# Patient Record
Sex: Female | Born: 1960 | Race: White | Hispanic: No | Smoking: Never smoker
Health system: Southern US, Community
[De-identification: ages and names within clinical notes are randomized; demographics above are authoritative.]

---

## 1997-07-17 ENCOUNTER — Other Ambulatory Visit: Admission: RE | Admit: 1997-07-17 | Discharge: 1997-07-17 | Payer: Self-pay | Admitting: *Deleted

## 1998-07-19 ENCOUNTER — Other Ambulatory Visit: Admission: RE | Admit: 1998-07-19 | Discharge: 1998-07-19 | Payer: Self-pay | Admitting: Obstetrics and Gynecology

## 1999-08-06 ENCOUNTER — Other Ambulatory Visit: Admission: RE | Admit: 1999-08-06 | Discharge: 1999-08-06 | Payer: Self-pay | Admitting: Obstetrics and Gynecology

## 1999-10-06 ENCOUNTER — Other Ambulatory Visit: Admission: RE | Admit: 1999-10-06 | Discharge: 1999-10-06 | Payer: Self-pay | Admitting: Obstetrics and Gynecology

## 1999-10-20 ENCOUNTER — Other Ambulatory Visit: Admission: RE | Admit: 1999-10-20 | Discharge: 1999-10-20 | Payer: Self-pay | Admitting: Obstetrics and Gynecology

## 1999-10-20 ENCOUNTER — Encounter (INDEPENDENT_AMBULATORY_CARE_PROVIDER_SITE_OTHER): Payer: Self-pay | Admitting: Specialist

## 2000-05-14 ENCOUNTER — Other Ambulatory Visit: Admission: RE | Admit: 2000-05-14 | Discharge: 2000-05-14 | Payer: Self-pay | Admitting: Obstetrics and Gynecology

## 2000-12-07 ENCOUNTER — Other Ambulatory Visit: Admission: RE | Admit: 2000-12-07 | Discharge: 2000-12-07 | Payer: Self-pay | Admitting: Obstetrics and Gynecology

## 2001-05-09 ENCOUNTER — Other Ambulatory Visit: Admission: RE | Admit: 2001-05-09 | Discharge: 2001-05-09 | Payer: Self-pay | Admitting: Obstetrics and Gynecology

## 2001-12-09 ENCOUNTER — Other Ambulatory Visit: Admission: RE | Admit: 2001-12-09 | Discharge: 2001-12-09 | Payer: Self-pay | Admitting: Obstetrics and Gynecology

## 2003-01-02 ENCOUNTER — Other Ambulatory Visit: Admission: RE | Admit: 2003-01-02 | Discharge: 2003-01-02 | Payer: Self-pay | Admitting: Obstetrics and Gynecology

## 2004-01-22 ENCOUNTER — Other Ambulatory Visit: Admission: RE | Admit: 2004-01-22 | Discharge: 2004-01-22 | Payer: Self-pay | Admitting: Obstetrics and Gynecology

## 2005-02-11 ENCOUNTER — Other Ambulatory Visit: Admission: RE | Admit: 2005-02-11 | Discharge: 2005-02-11 | Payer: Self-pay | Admitting: Obstetrics and Gynecology

## 2006-08-11 ENCOUNTER — Emergency Department (HOSPITAL_COMMUNITY): Admission: EM | Admit: 2006-08-11 | Discharge: 2006-08-11 | Payer: Self-pay | Admitting: Emergency Medicine

## 2010-03-28 ENCOUNTER — Emergency Department (HOSPITAL_COMMUNITY)
Admission: EM | Admit: 2010-03-28 | Discharge: 2010-03-28 | Payer: Self-pay | Source: Home / Self Care | Admitting: Family Medicine

## 2015-03-15 MED FILL — clonazePAM 0.5 MG TABS: 0.5 | 30 days supply | Qty: 30 | Fill #0

## 2015-03-18 MED FILL — SYNTHROID 75 MCG TABLET: 75 | 90 days supply | Qty: 90 | Fill #0

## 2015-04-16 MED FILL — clonazePAM 0.5 MG TABS: 0.5 | 30 days supply | Qty: 30 | Fill #0

## 2015-05-01 MED FILL — PROGESTERONE 100 MG CAPSULE: 100 | 90 days supply | Qty: 90 | Fill #2

## 2015-05-06 DIAGNOSIS — H524 Presbyopia: Secondary | ICD-10-CM | POA: Diagnosis not present

## 2015-05-06 DIAGNOSIS — H5213 Myopia, bilateral: Secondary | ICD-10-CM | POA: Diagnosis not present

## 2015-05-14 MED FILL — clonazePAM 0.5 MG TABS: 0.5 | 30 days supply | Qty: 30 | Fill #0

## 2015-05-20 DIAGNOSIS — L0292 Furuncle, unspecified: Secondary | ICD-10-CM | POA: Diagnosis not present

## 2015-05-20 DIAGNOSIS — N9089 Other specified noninflammatory disorders of vulva and perineum: Secondary | ICD-10-CM | POA: Diagnosis not present

## 2015-06-13 MED FILL — clonazePAM 0.5 MG TABS: 0.5 | 30 days supply | Qty: 30 | Fill #0

## 2015-06-17 MED FILL — SYNTHROID 75 MCG TABLET: 75 | 90 days supply | Qty: 90 | Fill #1

## 2015-07-11 MED FILL — clonazePAM 0.5 MG TABS: 0.5 | 30 days supply | Qty: 30 | Fill #0

## 2015-07-31 MED FILL — PROGESTERONE 100 MG CAPSULE: 100 | 90 days supply | Qty: 90 | Fill #3

## 2015-09-04 MED FILL — clonazePAM 0.5 MG TABS: 0.5 | 30 days supply | Qty: 30 | Fill #0

## 2015-09-23 MED FILL — SYNTHROID 75 MCG TABLET: 75 | 90 days supply | Qty: 90 | Fill #2

## 2015-09-24 MED FILL — ZOLPIDEM TART ER 12.5 MG TA: 12.5 | 20 days supply | Qty: 20 | Fill #0

## 2015-10-18 MED FILL — ZOLPIDEM TART ER 12.5 MG TA: 12.5 | 20 days supply | Qty: 20 | Fill #0

## 2015-10-29 MED FILL — PROGESTERONE 100 MG CAPSULE: 100 | 90 days supply | Qty: 90 | Fill #0

## 2015-11-05 DIAGNOSIS — E039 Hypothyroidism, unspecified: Secondary | ICD-10-CM | POA: Diagnosis not present

## 2015-11-05 DIAGNOSIS — Z0000 Encounter for general adult medical examination without abnormal findings: Secondary | ICD-10-CM | POA: Diagnosis not present

## 2015-11-08 MED FILL — ZOLPIDEM TART ER 12.5 MG TA: 12.5 | 20 days supply | Qty: 20 | Fill #1

## 2015-11-11 DIAGNOSIS — Z8781 Personal history of (healed) traumatic fracture: Secondary | ICD-10-CM | POA: Diagnosis not present

## 2015-11-11 DIAGNOSIS — Z0000 Encounter for general adult medical examination without abnormal findings: Secondary | ICD-10-CM | POA: Diagnosis not present

## 2015-11-11 DIAGNOSIS — F5104 Psychophysiologic insomnia: Secondary | ICD-10-CM | POA: Diagnosis not present

## 2015-11-11 DIAGNOSIS — L719 Rosacea, unspecified: Secondary | ICD-10-CM | POA: Diagnosis not present

## 2015-11-11 MED FILL — SERTRALINE HCL 50 MG TABLET: 50 | 90 days supply | Qty: 90 | Fill #0

## 2015-11-25 DIAGNOSIS — Z1231 Encounter for screening mammogram for malignant neoplasm of breast: Secondary | ICD-10-CM | POA: Diagnosis not present

## 2015-11-25 DIAGNOSIS — Z01419 Encounter for gynecological examination (general) (routine) without abnormal findings: Secondary | ICD-10-CM | POA: Diagnosis not present

## 2015-11-25 DIAGNOSIS — Z6822 Body mass index (BMI) 22.0-22.9, adult: Secondary | ICD-10-CM | POA: Diagnosis not present

## 2015-11-28 MED FILL — ZOLPIDEM TART ER 12.5 MG TA: 12.5 | 20 days supply | Qty: 20 | Fill #2

## 2015-12-18 MED FILL — ZOLPIDEM TART ER 12.5 MG TA: 12.5 | 30 days supply | Qty: 30 | Fill #0

## 2015-12-23 MED FILL — SYNTHROID 75 MCG TABLET: 75 | 90 days supply | Qty: 90 | Fill #3

## 2015-12-30 DIAGNOSIS — F5104 Psychophysiologic insomnia: Secondary | ICD-10-CM | POA: Diagnosis not present

## 2015-12-30 DIAGNOSIS — F419 Anxiety disorder, unspecified: Secondary | ICD-10-CM | POA: Diagnosis not present

## 2015-12-31 MED FILL — ESZOPICLONE 3 MG TABLET: 3 | 30 days supply | Qty: 30 | Fill #0

## 2016-01-29 MED FILL — PROGESTERONE 100 MG CAPSULE: 100 | 90 days supply | Qty: 90 | Fill #0

## 2016-01-29 MED FILL — ESZOPICLONE 3 MG TABLET: 3 | 30 days supply | Qty: 30 | Fill #1

## 2016-02-21 MED FILL — SERTRALINE HCL 50 MG TABLET: 50 | 90 days supply | Qty: 90 | Fill #1

## 2016-02-21 MED FILL — MESALAMINE DR 1.2 GM TABLET: 1.2 | 30 days supply | Qty: 120 | Fill #0

## 2016-02-28 MED FILL — ESZOPICLONE 3 MG TABLET: 3 | 30 days supply | Qty: 30 | Fill #0

## 2016-03-06 MED FILL — OSELTAMIVIR PHOS 75 MG CAP: 75 | 5 days supply | Qty: 10 | Fill #0

## 2016-03-09 DIAGNOSIS — K5289 Other specified noninfective gastroenteritis and colitis: Secondary | ICD-10-CM | POA: Diagnosis not present

## 2016-03-16 DIAGNOSIS — R197 Diarrhea, unspecified: Secondary | ICD-10-CM | POA: Diagnosis not present

## 2016-03-20 MED FILL — SYNTHROID 75 MCG TABLET: 75 | 90 days supply | Qty: 90 | Fill #0

## 2016-03-20 MED FILL — LIALDA 1.2 GM TABLET SA: 1.2 | 90 days supply | Qty: 360 | Fill #1

## 2016-03-25 DIAGNOSIS — R197 Diarrhea, unspecified: Secondary | ICD-10-CM | POA: Diagnosis not present

## 2016-03-30 MED FILL — ESZOPICLONE 3 MG TABLET: 3 | 30 days supply | Qty: 30 | Fill #1

## 2016-04-27 MED FILL — PROGESTERONE 100 MG CAPSULE: 100 | 90 days supply | Qty: 90 | Fill #1

## 2016-04-27 MED FILL — ESZOPICLONE 3 MG TABLET: 3 | 30 days supply | Qty: 30 | Fill #2

## 2016-04-30 DIAGNOSIS — F419 Anxiety disorder, unspecified: Secondary | ICD-10-CM | POA: Diagnosis not present

## 2016-04-30 DIAGNOSIS — K5289 Other specified noninfective gastroenteritis and colitis: Secondary | ICD-10-CM | POA: Diagnosis not present

## 2016-05-06 DIAGNOSIS — H5213 Myopia, bilateral: Secondary | ICD-10-CM | POA: Diagnosis not present

## 2016-05-06 DIAGNOSIS — H524 Presbyopia: Secondary | ICD-10-CM | POA: Diagnosis not present

## 2016-05-25 MED FILL — SERTRALINE HCL 50 MG TABLET: 50 | 90 days supply | Qty: 90 | Fill #2

## 2016-05-25 MED FILL — ESZOPICLONE 3 MG TABLET: 3 | 30 days supply | Qty: 30 | Fill #0

## 2016-06-15 MED FILL — SYNTHROID 75 MCG TABLET: 75 | 90 days supply | Qty: 90 | Fill #1

## 2016-06-17 MED FILL — LIALDA 1.2 GM TABLET SA: 1.2 | 90 days supply | Qty: 360 | Fill #2

## 2016-06-25 MED FILL — ESZOPICLONE 3 MG TABLET: 3 | 30 days supply | Qty: 30 | Fill #1

## 2016-07-23 MED FILL — ESZOPICLONE 3 MG TABLET: 3 | 30 days supply | Qty: 30 | Fill #2

## 2016-07-23 MED FILL — PROGESTERONE 100 MG CAPSULE: 100 | 90 days supply | Qty: 90 | Fill #2

## 2016-08-26 MED FILL — SERTRALINE HCL 50 MG TABLET: 50 | 90 days supply | Qty: 90 | Fill #3

## 2016-08-27 MED FILL — ESZOPICLONE 3 MG TABLET: 3 | 30 days supply | Qty: 30 | Fill #0

## 2016-09-16 MED FILL — SYNTHROID 75 MCG TABLET: 75 | 30 days supply | Qty: 30 | Fill #2

## 2016-09-16 MED FILL — LIALDA 1.2 GM TABLET SA: 1.2 | 90 days supply | Qty: 360 | Fill #3

## 2016-09-28 MED FILL — ESZOPICLONE 3 MG TABLET: 3 | 30 days supply | Qty: 30 | Fill #1

## 2016-10-20 MED FILL — PROGESTERONE 100 MG CAPSULE: 100 | 90 days supply | Qty: 90 | Fill #3

## 2016-10-20 MED FILL — SYNTHROID 75 MCG TABLET: 75 | 30 days supply | Qty: 30 | Fill #3

## 2016-10-29 MED FILL — ESZOPICLONE 3 MG TABLET: 3 | 30 days supply | Qty: 30 | Fill #2

## 2016-11-04 DIAGNOSIS — E039 Hypothyroidism, unspecified: Secondary | ICD-10-CM | POA: Diagnosis not present

## 2016-11-04 DIAGNOSIS — Z0000 Encounter for general adult medical examination without abnormal findings: Secondary | ICD-10-CM | POA: Diagnosis not present

## 2016-11-12 DIAGNOSIS — E875 Hyperkalemia: Secondary | ICD-10-CM | POA: Diagnosis not present

## 2016-11-12 MED FILL — CYCLOBENZAPRINE 5 MG TABLET: 5 | 15 days supply | Qty: 45 | Fill #0

## 2016-11-12 MED FILL — SERTRALINE HCL 50 MG TABLET: 50 | 90 days supply | Qty: 90 | Fill #0

## 2016-11-13 MED FILL — SYNTHROID 75 MCG TABLET: 75 | 90 days supply | Qty: 90 | Fill #0

## 2016-11-26 DIAGNOSIS — Z01419 Encounter for gynecological examination (general) (routine) without abnormal findings: Secondary | ICD-10-CM | POA: Diagnosis not present

## 2016-11-26 DIAGNOSIS — Z1231 Encounter for screening mammogram for malignant neoplasm of breast: Secondary | ICD-10-CM | POA: Diagnosis not present

## 2016-11-26 DIAGNOSIS — Z6821 Body mass index (BMI) 21.0-21.9, adult: Secondary | ICD-10-CM | POA: Diagnosis not present

## 2016-11-26 DIAGNOSIS — Z13820 Encounter for screening for osteoporosis: Secondary | ICD-10-CM | POA: Diagnosis not present

## 2016-11-27 MED FILL — DIVIGEL 0.5 MG GEL PACKET: 0.5 | 30 days supply | Qty: 30 | Fill #0

## 2016-12-01 MED FILL — ESZOPICLONE 3 MG TABLET: 3 | 30 days supply | Qty: 30 | Fill #0

## 2016-12-17 MED FILL — LIALDA 1.2 GM TABLET SA: 1.2 | 60 days supply | Qty: 240 | Fill #4

## 2016-12-28 MED FILL — DIVIGEL 0.5 MG GEL PACKET: 0.5 | 30 days supply | Qty: 30 | Fill #1

## 2017-01-04 MED FILL — ESZOPICLONE 3 MG TABLET: 3 | 30 days supply | Qty: 30 | Fill #1

## 2017-01-13 MED FILL — CLOBETASOL 0.05% SOLUTION: 0.05 | 30 days supply | Qty: 50 | Fill #0

## 2017-01-15 MED FILL — DIVIGEL 1 MG GEL PACKET: 1 | 30 days supply | Qty: 30 | Fill #0

## 2017-01-25 MED FILL — PROGESTERONE 100 MG CAPSULE: 100 | 90 days supply | Qty: 90 | Fill #0

## 2017-02-02 MED FILL — ESZOPICLONE 3 MG TABLET: 3 | 30 days supply | Qty: 30 | Fill #2

## 2017-02-18 MED FILL — SERTRALINE HCL 50 MG TABLET: 50 | 90 days supply | Qty: 90 | Fill #1

## 2017-02-18 MED FILL — SYNTHROID 75 MCG TABLET: 75 | 90 days supply | Qty: 90 | Fill #1

## 2017-02-26 MED FILL — DIVIGEL 1 MG GEL PACKET: 1 | 30 days supply | Qty: 30 | Fill #1

## 2017-03-04 ENCOUNTER — Ambulatory Visit (INDEPENDENT_AMBULATORY_CARE_PROVIDER_SITE_OTHER): Payer: 59 | Admitting: Physician Assistant

## 2017-03-04 ENCOUNTER — Encounter (INDEPENDENT_AMBULATORY_CARE_PROVIDER_SITE_OTHER): Payer: Self-pay | Admitting: Physician Assistant

## 2017-03-04 ENCOUNTER — Ambulatory Visit (INDEPENDENT_AMBULATORY_CARE_PROVIDER_SITE_OTHER): Payer: 59

## 2017-03-04 DIAGNOSIS — M25561 Pain in right knee: Secondary | ICD-10-CM

## 2017-03-04 MED ORDER — LIDOCAINE HCL 1 % IJ SOLN
5.0000 mL | INTRAMUSCULAR | Status: AC | PRN
Start: 2017-03-04 — End: 2017-03-04
  Administered 2017-03-04: 5 mL

## 2017-03-04 MED ORDER — METHYLPREDNISOLONE ACETATE 40 MG/ML IJ SUSP
40.0000 mg | INTRAMUSCULAR | Status: AC | PRN
Start: 2017-03-04 — End: 2017-03-04
  Administered 2017-03-04: 40 mg via INTRA_ARTICULAR

## 2017-03-04 NOTE — Progress Notes (Signed)
Office Visit Note   Patient: Cynthia Cain           Date of Birth: 1960/01/27           MRN: [id number] Visit Date: 03/04/2017              Requested by: No referring provider defined for this encounter. PCP: Deland Pretty, MD   Assessment & Plan: Visit Diagnoses:  1. Acute pain of right knee     Plan: We will place her in a hinged knee brace.  Have her ice the knee.  She is to work on gentle range of motion of the knee and quad strengthening is shown.  She will continue her Advil.  Follow-up with [place] in 2 weeks.  If she sees no results from the injection her pain persist or becomes worse over the next 7-10 days she can always call the office and we can order an MRI especially she is having mechanical symptoms.  Follow-Up Instructions: Return in about 2 weeks (around 03/18/2017).   Orders:  Orders Placed This Encounter  Procedures  . Large Joint Inj: R knee  . XR KNEE 3 VIEW RIGHT   No orders of the defined types were placed in this encounter.     Procedures: Large Joint Inj: R knee on 03/04/2017 5:24 PM Indications: pain Details: 22 G 1.5 in needle, superolateral approach  Arthrogram: No  Medications: 40 mg methylPREDNISolone acetate 40 MG/ML; 5 mL lidocaine 1 % Aspirate: 5 mL blood-tinged Outcome: tolerated well, no immediate complications Procedure, treatment alternatives, risks and benefits explained, specific risks discussed. Consent was given by the patient. Immediately prior to procedure a time out was called to verify the correct patient, procedure, equipment, support staff and site/side marked as required. Patient was prepped and draped in the usual sterile fashion.       Clinical Data: No additional findings.   Subjective: Right knee pain  HPI Cynthia Cain is a 57 year old female who was at a wedding last night dancing when she had abrupt knee pain.  She does report some antecedent pain of the right knee but nothing that she would have seek medical  attention for.  Since last nights acute pain she has had difficulty ambulating especially going up and down steps.  She has pain when bearing weight on the right leg.  No true mechanical symptoms.  She has been taking Advil and icing the knee. Review of Systems Please see HPI otherwise negative  Objective: Vital Signs: There were no vitals taken for this visit.  Physical Exam  Constitutional: She is oriented to person, place, and time. She appears well-developed and well-nourished. No distress.  Pulmonary/Chest: Effort normal.  Neurological: She is alert and oriented to person, place, and time.  Skin: She is not diaphoretic.  Psychiatric: She has a normal mood and affect.    Ortho Exam Bilateral knees no rashes skin lesions ulcerations.  Right knee with slight edema and slight effusion.  No abnormal warmth or erythema either knee.  She has good range of motion of both knees.  Negative McMurray's bilaterally.  No instability valgus varus stressing of either knee.  She is tender along the lateral collateral ligament of the right knee.  Patella femoral crepitus with passive range of motion of the right knee.  Anterior drawer is negative right knee. Specialty Comments:  No specialty comments available.  Imaging: Xr Knee 3 View Right  Result Date: 03/04/2017 Right knee 3 views: No acute fracture.  Medial lateral joint lines overall well preserved.  On the sunrise view of the right knee she has some lateralization of the patella.  No subluxation dislocation of the knee.    PMFS History: There are no active problems to display for this patient.  History reviewed. No pertinent past medical history.  History reviewed. No pertinent family history.  History reviewed. No pertinent surgical history. Social History   Occupational History  . Not on file  Tobacco Use  . Smoking status: Not on file  Substance and Sexual Activity  . Alcohol use: Not on file  . Drug use: Not on file  . Sexual  activity: Not on file

## 2017-03-05 MED FILL — ESZOPICLONE 3 MG TABLET: 3 | 30 days supply | Qty: 30 | Fill #3

## 2017-03-18 ENCOUNTER — Encounter (INDEPENDENT_AMBULATORY_CARE_PROVIDER_SITE_OTHER): Payer: Self-pay | Admitting: Physician Assistant

## 2017-03-18 ENCOUNTER — Ambulatory Visit (INDEPENDENT_AMBULATORY_CARE_PROVIDER_SITE_OTHER): Payer: 59 | Admitting: Physician Assistant

## 2017-03-18 DIAGNOSIS — M25561 Pain in right knee: Secondary | ICD-10-CM | POA: Diagnosis not present

## 2017-03-18 NOTE — Progress Notes (Signed)
Cynthia Cain returns today to follow up on her right knee. States the knee is better but still with some achy pain . No mechanical symptoms.  Taking ibuprofen and wearing the kne brace. Still with tenderness along the lateral aspect of the knee.   Physical exam right knee no effusion abnormal warmth or erythema.  Full range of motion.  Passive range of motion reveals patellofemoral crepitus.  Slight tenderness along the lateral collateral ligament.  No instability valgus varus stressing.  Anterior drawer is negative McMurray's negative.  Impression: Right knee is sprain involving the lateral collateral ligament  Plan: Continue to work on range of motion strengthening.  Ice and over-the-counter NSAIDs.  She will continue the brace for another approximate 2-3 weeks and then wean out of the brace.  She will follow-up with [place] if her pain persist or becomes worse or she develops any mechanical symptoms or swelling of the knee.  Questions encouraged and answered at length today.

## 2017-03-30 MED FILL — DIVIGEL 1 MG GEL PACKET: 1 | 30 days supply | Qty: 30 | Fill #2

## 2017-04-06 MED FILL — ESZOPICLONE 3 MG TABLET: 3 | 30 days supply | Qty: 30 | Fill #0

## 2017-04-26 MED FILL — PROGESTERONE 100 MG CAPSULE: 100 | 90 days supply | Qty: 90 | Fill #1

## 2017-04-26 MED FILL — CLOBETASOL 0.05% SOLUTION: 0.05 | 30 days supply | Qty: 50 | Fill #1

## 2017-04-28 MED FILL — DIVIGEL 1 MG GEL PACKET: 1 | 30 days supply | Qty: 30 | Fill #3

## 2017-05-05 MED FILL — ESZOPICLONE 3 MG TABLET: 3 | 30 days supply | Qty: 30 | Fill #1

## 2017-05-06 DIAGNOSIS — H524 Presbyopia: Secondary | ICD-10-CM | POA: Diagnosis not present

## 2017-05-06 DIAGNOSIS — H5213 Myopia, bilateral: Secondary | ICD-10-CM | POA: Diagnosis not present

## 2017-05-26 MED FILL — DIVIGEL 1 MG GEL PACKET: 1 | 30 days supply | Qty: 30 | Fill #4

## 2017-05-26 MED FILL — SYNTHROID 75 MCG TABLET: 75 | 90 days supply | Qty: 90 | Fill #2

## 2017-05-26 MED FILL — SERTRALINE HCL 50 MG TABLET: 50 | 90 days supply | Qty: 90 | Fill #2

## 2017-06-03 MED FILL — ESZOPICLONE 3 MG TABLET: 3 | 30 days supply | Qty: 30 | Fill #2

## 2017-06-11 DIAGNOSIS — Z23 Encounter for immunization: Secondary | ICD-10-CM | POA: Diagnosis not present

## 2017-06-29 MED FILL — DIVIGEL 1 MG GEL PACKET: 1 | 30 days supply | Qty: 30 | Fill #5

## 2017-07-05 MED FILL — ESZOPICLONE 3 MG TABS: 3 | 30 days supply | Qty: 30 | Fill #3

## 2017-07-22 MED FILL — PROGESTERONE 100 MG CAPSULE: 100 | 90 days supply | Qty: 90 | Fill #2

## 2017-07-29 MED FILL — DIVIGEL 1 MG GEL PACKET: 1 | 30 days supply | Qty: 30 | Fill #6

## 2017-08-04 MED FILL — ESZOPICLONE 3 MG TABS: 3 | 30 days supply | Qty: 30 | Fill #0

## 2017-08-20 DIAGNOSIS — Z23 Encounter for immunization: Secondary | ICD-10-CM | POA: Diagnosis not present

## 2017-08-25 MED FILL — SYNTHROID 75 MCG TABLET: 75 | 90 days supply | Qty: 90 | Fill #3

## 2017-08-25 MED FILL — SERTRALINE HCL 50 MG TABLET: 50 | 90 days supply | Qty: 90 | Fill #3

## 2017-08-31 MED FILL — DIVIGEL 1 MG GEL PACKET: 1 | 30 days supply | Qty: 30 | Fill #7

## 2017-09-01 MED FILL — ESZOPICLONE 3 MG TABS: 3 | 30 days supply | Qty: 30 | Fill #1

## 2017-09-29 MED FILL — DIVIGEL 1 MG GEL PACKET: 1 | 30 days supply | Qty: 30 | Fill #8

## 2017-09-29 MED FILL — ESZOPICLONE 3 MG TABS: 3 | 30 days supply | Qty: 30 | Fill #2

## 2017-10-22 MED FILL — PROGESTERONE 100 MG CAPSULE: 100 | 90 days supply | Qty: 90 | Fill #3

## 2017-11-02 MED FILL — ESZOPICLONE 3 MG TABS: 3 | 30 days supply | Qty: 30 | Fill #3

## 2017-11-02 MED FILL — DIVIGEL 1 MG GEL PACKET: 1 | 30 days supply | Qty: 30 | Fill #9

## 2017-11-15 DIAGNOSIS — E039 Hypothyroidism, unspecified: Secondary | ICD-10-CM | POA: Diagnosis not present

## 2017-11-15 DIAGNOSIS — Z0000 Encounter for general adult medical examination without abnormal findings: Secondary | ICD-10-CM | POA: Diagnosis not present

## 2017-11-15 DIAGNOSIS — Z1321 Encounter for screening for nutritional disorder: Secondary | ICD-10-CM | POA: Diagnosis not present

## 2017-11-22 DIAGNOSIS — L409 Psoriasis, unspecified: Secondary | ICD-10-CM | POA: Diagnosis not present

## 2017-11-22 DIAGNOSIS — F419 Anxiety disorder, unspecified: Secondary | ICD-10-CM | POA: Diagnosis not present

## 2017-11-22 DIAGNOSIS — E039 Hypothyroidism, unspecified: Secondary | ICD-10-CM | POA: Diagnosis not present

## 2017-11-22 DIAGNOSIS — K5289 Other specified noninfective gastroenteritis and colitis: Secondary | ICD-10-CM | POA: Diagnosis not present

## 2017-11-22 DIAGNOSIS — B070 Plantar wart: Secondary | ICD-10-CM | POA: Diagnosis not present

## 2017-11-22 DIAGNOSIS — F5104 Psychophysiologic insomnia: Secondary | ICD-10-CM | POA: Diagnosis not present

## 2017-11-22 DIAGNOSIS — L719 Rosacea, unspecified: Secondary | ICD-10-CM | POA: Diagnosis not present

## 2017-11-22 DIAGNOSIS — M542 Cervicalgia: Secondary | ICD-10-CM | POA: Diagnosis not present

## 2017-11-22 DIAGNOSIS — Z8781 Personal history of (healed) traumatic fracture: Secondary | ICD-10-CM | POA: Diagnosis not present

## 2017-11-22 MED FILL — SYNTHROID 75 MCG TABLET: 75 | 90 days supply | Qty: 90 | Fill #0

## 2017-11-22 MED FILL — SERTRALINE HCL 50 MG TABLET: 50 | 90 days supply | Qty: 90 | Fill #0

## 2017-11-22 MED FILL — ESZOPICLONE 2 MG TAB: 2 | 30 days supply | Qty: 30 | Fill #0

## 2017-11-26 MED FILL — DIVIGEL 1 MG GEL PACKET: 1 | 30 days supply | Qty: 30 | Fill #10

## 2017-12-20 DIAGNOSIS — Z6822 Body mass index (BMI) 22.0-22.9, adult: Secondary | ICD-10-CM | POA: Diagnosis not present

## 2017-12-20 DIAGNOSIS — Z1231 Encounter for screening mammogram for malignant neoplasm of breast: Secondary | ICD-10-CM | POA: Diagnosis not present

## 2017-12-20 DIAGNOSIS — Z01419 Encounter for gynecological examination (general) (routine) without abnormal findings: Secondary | ICD-10-CM | POA: Diagnosis not present

## 2017-12-29 MED FILL — ESZOPICLONE 2 MG TAB: 2 | 30 days supply | Qty: 30 | Fill #1

## 2018-01-20 MED FILL — PROGESTERONE 100 MG CAPSULE: 100 | 90 days supply | Qty: 90 | Fill #0

## 2018-01-28 MED FILL — ESZOPICLONE 2 MG TAB: 2 | 30 days supply | Qty: 30 | Fill #2

## 2018-02-11 DIAGNOSIS — E559 Vitamin D deficiency, unspecified: Secondary | ICD-10-CM | POA: Diagnosis not present

## 2018-02-18 MED FILL — DIVIGEL 0.75 MG/0.75GM GEL: 0.75 | 30 days supply | Qty: 30 | Fill #0

## 2018-02-18 MED FILL — SERTRALINE HCL 50 MG TABLET: 50 | 90 days supply | Qty: 90 | Fill #1

## 2018-02-18 MED FILL — SYNTHROID 75 MCG TABLET: 75 | 90 days supply | Qty: 90 | Fill #1

## 2018-02-28 MED FILL — ESZOPICLONE 2 MG TAB: 2 | 30 days supply | Qty: 30 | Fill #3

## 2018-03-30 MED FILL — DIVIGEL 0.75 MG/0.75GM GEL: 0.75 | 30 days supply | Qty: 30 | Fill #1

## 2018-03-30 MED FILL — ESZOPICLONE 2 MG TAB: 2 | 30 days supply | Qty: 30 | Fill #4

## 2018-04-21 MED FILL — PROGESTERONE 100 MG CAPSULE: 100 | 90 days supply | Qty: 90 | Fill #0 | Status: TO

## 2018-04-29 MED FILL — ESZOPICLONE 2 MG TAB: 2 | 30 days supply | Qty: 30 | Fill #5

## 2018-04-29 MED FILL — DIVIGEL 0.75 MG/0.75GM GEL: 0.75 | 30 days supply | Qty: 30 | Fill #2 | Status: TO

## 2018-05-14 MED FILL — SYNTHROID 75 MCG TABLET: 75 | 90 days supply | Qty: 90 | Fill #2 | Status: TO

## 2018-05-14 MED FILL — SERTRALINE HCL 50 MG TABLET: 50 | 90 days supply | Qty: 90 | Fill #2 | Status: TO

## 2018-05-26 MED FILL — ESZOPICLONE 2 MG TAB: 2 | 90 days supply | Qty: 90 | Fill #0

## 2018-05-26 MED FILL — DIVIGEL 0.75 MG/0.75GM GEL: 0.75 | 30 days supply | Qty: 30 | Fill #0

## 2018-06-25 MED FILL — DIVIGEL 0.75 MG/0.75GM GEL: 0.75 | 30 days supply | Qty: 30 | Fill #1

## 2018-07-16 MED FILL — PROGESTERONE 100 MG CAPSULE: 100 | 90 days supply | Qty: 90 | Fill #0

## 2018-07-26 MED FILL — DIVIGEL 0.75 MG/0.75GM GEL: 0.75 | 30 days supply | Qty: 30 | Fill #2

## 2018-08-11 MED FILL — SERTRALINE HCL 50 MG TABLET: 50 | 90 days supply | Qty: 90 | Fill #0

## 2018-08-11 MED FILL — SYNTHROID 75 MCG TABLET: 75 | 90 days supply | Qty: 90 | Fill #0

## 2018-08-26 MED FILL — ESZOPICLONE 3 MG TABS: 3 | 30 days supply | Qty: 30 | Fill #0

## 2018-08-31 MED FILL — DIVIGEL 0.75 MG/0.75GM GEL: 0.75 | 30 days supply | Qty: 30 | Fill #3

## 2018-09-23 MED FILL — ESZOPICLONE 3 MG TABS: 3 | 30 days supply | Qty: 30 | Fill #0

## 2018-10-03 MED FILL — DIVIGEL 0.75 MG/0.75GM GEL: 0.75 | 30 days supply | Qty: 30 | Fill #0

## 2018-10-13 MED FILL — PROGESTERONE 100 MG CAPSULE: 100 | 90 days supply | Qty: 90 | Fill #1

## 2018-10-25 MED FILL — ESZOPICLONE 3 MG TABS: 3 | 30 days supply | Qty: 30 | Fill #1

## 2018-11-02 MED FILL — DIVIGEL 0.75 MG/0.75GM GEL: 0.75 | 30 days supply | Qty: 30 | Fill #0

## 2018-11-09 MED FILL — SYNTHROID 75 MCG TABLET: 75 | 90 days supply | Qty: 90 | Fill #1

## 2018-11-09 MED FILL — SERTRALINE HCL 50 MG TABLET: 50 | 90 days supply | Qty: 90 | Fill #1

## 2018-11-25 MED FILL — ESZOPICLONE 3 MG TABS: 3 | 30 days supply | Qty: 30 | Fill #2

## 2018-12-02 MED FILL — DIVIGEL 0.75 MG/0.75GM GEL: 0.75 | 30 days supply | Qty: 30 | Fill #1

## 2018-12-12 DIAGNOSIS — Z0000 Encounter for general adult medical examination without abnormal findings: Secondary | ICD-10-CM | POA: Diagnosis not present

## 2018-12-12 DIAGNOSIS — E039 Hypothyroidism, unspecified: Secondary | ICD-10-CM | POA: Diagnosis not present

## 2018-12-12 DIAGNOSIS — Z1159 Encounter for screening for other viral diseases: Secondary | ICD-10-CM | POA: Diagnosis not present

## 2018-12-19 DIAGNOSIS — H52203 Unspecified astigmatism, bilateral: Secondary | ICD-10-CM | POA: Diagnosis not present

## 2018-12-19 DIAGNOSIS — L409 Psoriasis, unspecified: Secondary | ICD-10-CM | POA: Diagnosis not present

## 2018-12-19 DIAGNOSIS — H5213 Myopia, bilateral: Secondary | ICD-10-CM | POA: Diagnosis not present

## 2018-12-19 DIAGNOSIS — M2550 Pain in unspecified joint: Secondary | ICD-10-CM | POA: Diagnosis not present

## 2018-12-19 DIAGNOSIS — Z0000 Encounter for general adult medical examination without abnormal findings: Secondary | ICD-10-CM | POA: Diagnosis not present

## 2018-12-19 DIAGNOSIS — F419 Anxiety disorder, unspecified: Secondary | ICD-10-CM | POA: Diagnosis not present

## 2018-12-19 DIAGNOSIS — M25562 Pain in left knee: Secondary | ICD-10-CM | POA: Diagnosis not present

## 2018-12-19 DIAGNOSIS — F5104 Psychophysiologic insomnia: Secondary | ICD-10-CM | POA: Diagnosis not present

## 2018-12-19 DIAGNOSIS — E039 Hypothyroidism, unspecified: Secondary | ICD-10-CM | POA: Diagnosis not present

## 2018-12-19 DIAGNOSIS — K5289 Other specified noninfective gastroenteritis and colitis: Secondary | ICD-10-CM | POA: Diagnosis not present

## 2018-12-19 MED FILL — CLOBETASOL 0.05% SOLUTION: 0.05 | 30 days supply | Qty: 50 | Fill #0

## 2018-12-26 DIAGNOSIS — Z01419 Encounter for gynecological examination (general) (routine) without abnormal findings: Secondary | ICD-10-CM | POA: Diagnosis not present

## 2018-12-26 DIAGNOSIS — Z6823 Body mass index (BMI) 23.0-23.9, adult: Secondary | ICD-10-CM | POA: Diagnosis not present

## 2018-12-26 DIAGNOSIS — Z1231 Encounter for screening mammogram for malignant neoplasm of breast: Secondary | ICD-10-CM | POA: Diagnosis not present

## 2018-12-26 MED FILL — ESZOPICLONE 3 MG TABS: 3 | 30 days supply | Qty: 30 | Fill #0

## 2019-01-02 DIAGNOSIS — M1990 Unspecified osteoarthritis, unspecified site: Secondary | ICD-10-CM | POA: Diagnosis not present

## 2019-01-02 DIAGNOSIS — M25569 Pain in unspecified knee: Secondary | ICD-10-CM | POA: Diagnosis not present

## 2019-01-02 DIAGNOSIS — M545 Low back pain: Secondary | ICD-10-CM | POA: Diagnosis not present

## 2019-01-02 DIAGNOSIS — K529 Noninfective gastroenteritis and colitis, unspecified: Secondary | ICD-10-CM | POA: Diagnosis not present

## 2019-01-02 DIAGNOSIS — Z8042 Family history of malignant neoplasm of prostate: Secondary | ICD-10-CM | POA: Diagnosis not present

## 2019-01-02 DIAGNOSIS — M542 Cervicalgia: Secondary | ICD-10-CM | POA: Diagnosis not present

## 2019-01-02 DIAGNOSIS — M533 Sacrococcygeal disorders, not elsewhere classified: Secondary | ICD-10-CM | POA: Diagnosis not present

## 2019-01-02 DIAGNOSIS — M546 Pain in thoracic spine: Secondary | ICD-10-CM | POA: Diagnosis not present

## 2019-01-02 DIAGNOSIS — L4050 Arthropathic psoriasis, unspecified: Secondary | ICD-10-CM | POA: Diagnosis not present

## 2019-01-02 DIAGNOSIS — M549 Dorsalgia, unspecified: Secondary | ICD-10-CM | POA: Diagnosis not present

## 2019-01-02 DIAGNOSIS — Z803 Family history of malignant neoplasm of breast: Secondary | ICD-10-CM | POA: Diagnosis not present

## 2019-01-11 MED FILL — PROGESTERONE 100 MG CAPSULE: 100 | 90 days supply | Qty: 90 | Fill #2

## 2019-01-24 MED FILL — ESZOPICLONE 3 MG TABS: 3 | 90 days supply | Qty: 90 | Fill #0

## 2019-02-06 DIAGNOSIS — Z803 Family history of malignant neoplasm of breast: Secondary | ICD-10-CM | POA: Diagnosis not present

## 2019-02-06 DIAGNOSIS — Z9189 Other specified personal risk factors, not elsewhere classified: Secondary | ICD-10-CM | POA: Diagnosis not present

## 2019-02-06 MED FILL — SYNTHROID 75 MCG TABLET: 75 | 90 days supply | Qty: 90 | Fill #0

## 2019-02-06 MED FILL — SERTRALINE HCL 50 MG TABS: 50 | 90 days supply | Qty: 90 | Fill #0 | Status: TO

## 2019-02-06 MED FILL — SERTRALINE HCL 50 MG TABLET: 50 | 90 days supply | Qty: 90 | Fill #0

## 2019-02-10 ENCOUNTER — Other Ambulatory Visit: Payer: Self-pay | Admitting: Obstetrics and Gynecology

## 2019-02-10 DIAGNOSIS — Z803 Family history of malignant neoplasm of breast: Secondary | ICD-10-CM

## 2019-02-20 DIAGNOSIS — M542 Cervicalgia: Secondary | ICD-10-CM | POA: Diagnosis not present

## 2019-02-20 DIAGNOSIS — M461 Sacroiliitis, not elsewhere classified: Secondary | ICD-10-CM | POA: Diagnosis not present

## 2019-02-20 DIAGNOSIS — M545 Low back pain: Secondary | ICD-10-CM | POA: Diagnosis not present

## 2019-02-20 DIAGNOSIS — M419 Scoliosis, unspecified: Secondary | ICD-10-CM | POA: Diagnosis not present

## 2019-02-20 DIAGNOSIS — M549 Dorsalgia, unspecified: Secondary | ICD-10-CM | POA: Diagnosis not present

## 2019-02-20 DIAGNOSIS — K529 Noninfective gastroenteritis and colitis, unspecified: Secondary | ICD-10-CM | POA: Diagnosis not present

## 2019-02-20 DIAGNOSIS — M1990 Unspecified osteoarthritis, unspecified site: Secondary | ICD-10-CM | POA: Diagnosis not present

## 2019-02-20 DIAGNOSIS — L4050 Arthropathic psoriasis, unspecified: Secondary | ICD-10-CM | POA: Diagnosis not present

## 2019-02-20 DIAGNOSIS — M25569 Pain in unspecified knee: Secondary | ICD-10-CM | POA: Diagnosis not present

## 2019-03-06 MED FILL — DIVIGEL 0.5 MG GEL PACKET: 0.5 | 90 days supply | Qty: 90 | Fill #0

## 2019-03-31 ENCOUNTER — Other Ambulatory Visit (HOSPITAL_COMMUNITY): Payer: Self-pay | Admitting: Obstetrics and Gynecology

## 2019-03-31 MED FILL — DIVIGEL 0.75 MG/0.75GM GEL: 0.75 | 90 days supply | Qty: 90 | Fill #0

## 2019-04-10 MED FILL — PROGESTERONE 100 MG CAPSULE: 100 | 90 days supply | Qty: 90 | Fill #0

## 2019-04-24 MED FILL — ESZOPICLONE 3 MG TABS: 3 | 90 days supply | Qty: 90 | Fill #1

## 2019-05-01 DIAGNOSIS — D492 Neoplasm of unspecified behavior of bone, soft tissue, and skin: Secondary | ICD-10-CM | POA: Diagnosis not present

## 2019-05-10 MED FILL — SERTRALINE HCL 50 MG TABLET: 50 | 90 days supply | Qty: 90 | Fill #1

## 2019-05-10 MED FILL — SYNTHROID 75 MCG TABLET: 75 | 90 days supply | Qty: 90 | Fill #1

## 2019-06-02 DIAGNOSIS — M25512 Pain in left shoulder: Secondary | ICD-10-CM | POA: Diagnosis not present

## 2019-06-02 DIAGNOSIS — M13812 Other specified arthritis, left shoulder: Secondary | ICD-10-CM | POA: Diagnosis not present

## 2019-07-20 MED FILL — ESZOPICLONE 3 MG TABS: 3 | 90 days supply | Qty: 90 | Fill #0

## 2019-09-29 ENCOUNTER — Ambulatory Visit (HOSPITAL_COMMUNITY)
Admission: RE | Admit: 2019-09-29 | Discharge: 2019-09-29 | Disposition: A | Discharge status: Home / Self Care | Payer: 59 | Source: Ambulatory Visit | Attending: Obstetrics and Gynecology | Admitting: Obstetrics and Gynecology

## 2019-09-29 ENCOUNTER — Other Ambulatory Visit: Payer: Self-pay

## 2019-09-29 DIAGNOSIS — Z803 Family history of malignant neoplasm of breast: Secondary | ICD-10-CM | POA: Insufficient documentation

## 2019-09-29 DIAGNOSIS — Z1239 Encounter for other screening for malignant neoplasm of breast: Secondary | ICD-10-CM | POA: Diagnosis not present

## 2019-09-29 DIAGNOSIS — N6489 Other specified disorders of breast: Secondary | ICD-10-CM | POA: Diagnosis not present

## 2019-09-29 MED ORDER — GADOBUTROL 1 MMOL/ML IV SOLN
6.0000 mL | Freq: Once | INTRAVENOUS | Status: AC | PRN
  Administered 2019-09-29: 6 mL via INTRAVENOUS

## 2019-10-10 MED FILL — PROGESTERONE 100 MG CAPS: 100 | 90 days supply | Qty: 90 | Fill #2

## 2019-10-11 MED FILL — CLOBETASOL 0.05% SOLUTION: 0.05 | 30 days supply | Qty: 50 | Fill #1

## 2019-10-20 MED FILL — ESZOPICLONE 3 MG TABS: 3 | 90 days supply | Qty: 90 | Fill #1

## 2019-11-10 MED FILL — SERTRALINE HCL 50 MG TABLET: 50 | 90 days supply | Qty: 90 | Fill #3

## 2019-11-10 MED FILL — DIVIGEL 0.75 MG/0.75GM GEL: 0.75 | 90 days supply | Qty: 90 | Fill #2

## 2019-11-10 MED FILL — SYNTHROID 75 MCG TABLET: 75 | 90 days supply | Qty: 90 | Fill #3

## 2019-12-18 DIAGNOSIS — Z0000 Encounter for general adult medical examination without abnormal findings: Secondary | ICD-10-CM | POA: Diagnosis not present

## 2019-12-18 DIAGNOSIS — E039 Hypothyroidism, unspecified: Secondary | ICD-10-CM | POA: Diagnosis not present

## 2019-12-25 DIAGNOSIS — Z0000 Encounter for general adult medical examination without abnormal findings: Secondary | ICD-10-CM | POA: Diagnosis not present

## 2019-12-25 DIAGNOSIS — H5213 Myopia, bilateral: Secondary | ICD-10-CM | POA: Diagnosis not present

## 2019-12-25 DIAGNOSIS — M419 Scoliosis, unspecified: Secondary | ICD-10-CM | POA: Diagnosis not present

## 2019-12-25 DIAGNOSIS — M1990 Unspecified osteoarthritis, unspecified site: Secondary | ICD-10-CM | POA: Diagnosis not present

## 2019-12-25 DIAGNOSIS — L409 Psoriasis, unspecified: Secondary | ICD-10-CM | POA: Diagnosis not present

## 2019-12-25 DIAGNOSIS — E039 Hypothyroidism, unspecified: Secondary | ICD-10-CM | POA: Diagnosis not present

## 2019-12-25 DIAGNOSIS — M2550 Pain in unspecified joint: Secondary | ICD-10-CM | POA: Diagnosis not present

## 2019-12-25 DIAGNOSIS — L719 Rosacea, unspecified: Secondary | ICD-10-CM | POA: Diagnosis not present

## 2020-01-01 ENCOUNTER — Other Ambulatory Visit (HOSPITAL_COMMUNITY): Payer: Self-pay | Admitting: Obstetrics and Gynecology

## 2020-01-01 DIAGNOSIS — Z6823 Body mass index (BMI) 23.0-23.9, adult: Secondary | ICD-10-CM | POA: Diagnosis not present

## 2020-01-01 DIAGNOSIS — Z1231 Encounter for screening mammogram for malignant neoplasm of breast: Secondary | ICD-10-CM | POA: Diagnosis not present

## 2020-01-01 DIAGNOSIS — Z01419 Encounter for gynecological examination (general) (routine) without abnormal findings: Secondary | ICD-10-CM | POA: Diagnosis not present

## 2020-01-01 MED FILL — PROGESTERONE 100 MG CAPS: 100 | 30 days supply | Qty: 30 | Fill #0

## 2020-01-02 DIAGNOSIS — Z01419 Encounter for gynecological examination (general) (routine) without abnormal findings: Secondary | ICD-10-CM | POA: Diagnosis not present

## 2020-01-19 ENCOUNTER — Other Ambulatory Visit (HOSPITAL_COMMUNITY): Payer: Self-pay | Admitting: Internal Medicine

## 2020-01-19 MED FILL — ESZOPICLONE 3 MG TABS: 3 | 90 days supply | Qty: 90 | Fill #0

## 2020-02-07 MED FILL — PROGESTERONE 100 MG CAPS: 100 | 30 days supply | Qty: 30 | Fill #1

## 2020-02-08 ENCOUNTER — Other Ambulatory Visit (HOSPITAL_COMMUNITY): Payer: Self-pay | Admitting: Internal Medicine

## 2020-02-08 MED FILL — SERTRALINE HCL 50 MG TABLET: 50 | 90 days supply | Qty: 90 | Fill #0

## 2020-02-08 MED FILL — SYNTHROID 75 MCG TABLET: 75 | 90 days supply | Qty: 90 | Fill #0

## 2020-03-08 MED FILL — PROGESTERONE 100 MG CAPS: 100 | 30 days supply | Qty: 30 | Fill #2

## 2020-03-26 MED FILL — DIVIGEL 0.75 MG/0.75GM GEL: 0.75 | 90 days supply | Qty: 90 | Fill #3

## 2020-04-09 MED FILL — PROGESTERONE 100 MG CAPS: 100 | 30 days supply | Qty: 30 | Fill #3

## 2020-04-19 MED FILL — ESZOPICLONE 3 MG TABS: 3 | 90 days supply | Qty: 90 | Fill #1

## 2020-05-08 MED FILL — SYNTHROID 75 MCG TABLET: 75 | 90 days supply | Qty: 90 | Fill #1

## 2020-05-08 MED FILL — SERTRALINE HCL 50 MG TABS: 50 | 90 days supply | Qty: 90 | Fill #1

## 2020-05-08 MED FILL — PROGESTERONE 100 MG CAPS: 100 | 30 days supply | Qty: 30 | Fill #4

## 2020-05-24 ENCOUNTER — Other Ambulatory Visit (HOSPITAL_BASED_OUTPATIENT_CLINIC_OR_DEPARTMENT_OTHER): Payer: Self-pay

## 2020-06-09 ENCOUNTER — Other Ambulatory Visit (HOSPITAL_COMMUNITY): Payer: Self-pay | Admitting: Obstetrics and Gynecology

## 2020-06-09 MED FILL — Progesterone Cap 100 MG: ORAL | 30 days supply | Qty: 30 | Fill #0 | Status: AC

## 2020-06-10 ENCOUNTER — Ambulatory Visit: Payer: 59

## 2020-06-10 ENCOUNTER — Other Ambulatory Visit (HOSPITAL_COMMUNITY): Payer: Self-pay

## 2020-06-17 ENCOUNTER — Other Ambulatory Visit: Payer: Self-pay

## 2020-06-17 ENCOUNTER — Ambulatory Visit: Payer: 59 | Attending: Internal Medicine

## 2020-06-17 ENCOUNTER — Other Ambulatory Visit (HOSPITAL_BASED_OUTPATIENT_CLINIC_OR_DEPARTMENT_OTHER): Payer: Self-pay

## 2020-06-17 DIAGNOSIS — Z23 Encounter for immunization: Secondary | ICD-10-CM

## 2020-06-17 MED ORDER — COVID-19 MRNA VACCINE (PFIZER) 30 MCG/0.3ML IM SUSP
INTRAMUSCULAR | 0 refills | Status: AC
  Filled 2020-06-17: qty 0.3, 1d supply, fill #0

## 2020-06-17 NOTE — Progress Notes (Signed)
   Covid-19 Vaccination Clinic  Name:  Cynthia Cain    MRN: [id number] DOB: 06-20-1960  06/17/2020  [name] was observed post Covid-19 immunization for 15 minutes without incident. She was provided with Vaccine Information Sheet and instruction to access the V-Safe system.   [name] was instructed to call 911 with any severe reactions post vaccine: Marland Kitchen Difficulty breathing  . Swelling of face and throat  . A fast heartbeat  . A bad rash all over body  . Dizziness and weakness   Immunizations Administered    Name Date Dose VIS Date Route   PFIZER Comrnaty(Gray TOP) Covid-19 Vaccine 06/17/2020  3:52 PM 0.3 mL 02/08/2020 Intramuscular   Manufacturer: [employer], [employer]   Lot: [id number]   NDC: [phone]

## 2020-06-21 ENCOUNTER — Other Ambulatory Visit (HOSPITAL_COMMUNITY): Payer: Self-pay | Admitting: Obstetrics and Gynecology

## 2020-06-21 ENCOUNTER — Other Ambulatory Visit (HOSPITAL_COMMUNITY): Payer: Self-pay

## 2020-06-25 ENCOUNTER — Other Ambulatory Visit (HOSPITAL_COMMUNITY): Payer: Self-pay

## 2020-06-26 ENCOUNTER — Other Ambulatory Visit (HOSPITAL_COMMUNITY): Payer: Self-pay

## 2020-06-26 MED ORDER — DIVIGEL 0.75 MG/0.75GM TD GEL
TRANSDERMAL | 11 refills | Status: DC
  Filled 2020-06-27: qty 30, 30d supply, fill #0
  Filled 2020-07-26: qty 30, 30d supply, fill #1
  Filled 2020-08-27: qty 30, 30d supply, fill #2
  Filled 2020-10-06: qty 30, 30d supply, fill #3
  Filled 2020-11-04: qty 30, 30d supply, fill #4
  Filled 2020-12-02: qty 30, 30d supply, fill #5
  Filled 2020-12-30 – 2020-12-31 (×2): qty 30, 30d supply, fill #6

## 2020-06-27 ENCOUNTER — Other Ambulatory Visit (HOSPITAL_COMMUNITY): Payer: Self-pay

## 2020-06-28 ENCOUNTER — Other Ambulatory Visit (HOSPITAL_BASED_OUTPATIENT_CLINIC_OR_DEPARTMENT_OTHER): Payer: Self-pay

## 2020-06-28 ENCOUNTER — Other Ambulatory Visit (HOSPITAL_COMMUNITY): Payer: Self-pay

## 2020-07-09 MED FILL — Progesterone Cap 100 MG: ORAL | 30 days supply | Qty: 30 | Fill #1 | Status: AC

## 2020-07-10 ENCOUNTER — Other Ambulatory Visit (HOSPITAL_COMMUNITY): Payer: Self-pay

## 2020-07-15 ENCOUNTER — Other Ambulatory Visit (HOSPITAL_COMMUNITY): Payer: Self-pay

## 2020-07-15 ENCOUNTER — Other Ambulatory Visit (HOSPITAL_COMMUNITY): Payer: Self-pay | Admitting: Internal Medicine

## 2020-07-15 MED ORDER — CLOBETASOL PROPIONATE 0.05 % EX SOLN
CUTANEOUS | 1 refills | Status: DC
  Filled 2020-07-15: qty 50, 30d supply, fill #0

## 2020-07-16 ENCOUNTER — Other Ambulatory Visit (HOSPITAL_COMMUNITY): Payer: Self-pay

## 2020-07-17 ENCOUNTER — Other Ambulatory Visit (HOSPITAL_COMMUNITY): Payer: Self-pay | Admitting: Internal Medicine

## 2020-07-17 ENCOUNTER — Other Ambulatory Visit (HOSPITAL_COMMUNITY): Payer: Self-pay

## 2020-07-18 ENCOUNTER — Other Ambulatory Visit (HOSPITAL_COMMUNITY): Payer: Self-pay

## 2020-07-19 ENCOUNTER — Other Ambulatory Visit (HOSPITAL_COMMUNITY): Payer: Self-pay

## 2020-07-19 MED ORDER — ESZOPICLONE 3 MG PO TABS
ORAL_TABLET | ORAL | 1 refills | Status: DC
  Filled 2020-07-19: qty 90, 90d supply, fill #0
  Filled 2020-10-17: qty 90, 90d supply, fill #1

## 2020-07-25 ENCOUNTER — Other Ambulatory Visit (HOSPITAL_COMMUNITY): Payer: Self-pay

## 2020-07-26 ENCOUNTER — Other Ambulatory Visit (HOSPITAL_COMMUNITY): Payer: Self-pay

## 2020-07-30 ENCOUNTER — Other Ambulatory Visit (HOSPITAL_COMMUNITY): Payer: Self-pay

## 2020-08-08 MED FILL — Sertraline HCl Tab 50 MG: ORAL | 90 days supply | Qty: 90 | Fill #0 | Status: AC

## 2020-08-08 MED FILL — Levothyroxine Sodium Tab 75 MCG: ORAL | 90 days supply | Qty: 90 | Fill #0 | Status: AC

## 2020-08-08 MED FILL — Progesterone Cap 100 MG: ORAL | 30 days supply | Qty: 30 | Fill #2 | Status: AC

## 2020-08-09 ENCOUNTER — Other Ambulatory Visit (HOSPITAL_COMMUNITY): Payer: Self-pay

## 2020-08-28 ENCOUNTER — Other Ambulatory Visit (HOSPITAL_COMMUNITY): Payer: Self-pay

## 2020-08-30 ENCOUNTER — Other Ambulatory Visit (HOSPITAL_COMMUNITY): Payer: Self-pay

## 2020-09-03 ENCOUNTER — Other Ambulatory Visit (HOSPITAL_COMMUNITY): Payer: Self-pay

## 2020-09-06 ENCOUNTER — Other Ambulatory Visit (HOSPITAL_COMMUNITY): Payer: Self-pay

## 2020-09-06 MED FILL — Progesterone Cap 100 MG: ORAL | 30 days supply | Qty: 30 | Fill #3 | Status: AC

## 2020-10-06 MED FILL — Progesterone Cap 100 MG: ORAL | 30 days supply | Qty: 30 | Fill #4 | Status: AC

## 2020-10-07 ENCOUNTER — Other Ambulatory Visit (HOSPITAL_COMMUNITY): Payer: Self-pay

## 2020-10-08 ENCOUNTER — Other Ambulatory Visit (HOSPITAL_COMMUNITY): Payer: Self-pay

## 2020-10-18 ENCOUNTER — Other Ambulatory Visit (HOSPITAL_COMMUNITY): Payer: Self-pay

## 2020-11-04 ENCOUNTER — Other Ambulatory Visit (HOSPITAL_COMMUNITY): Payer: Self-pay

## 2020-11-04 MED FILL — Progesterone Cap 100 MG: ORAL | 30 days supply | Qty: 30 | Fill #5 | Status: AC

## 2020-11-04 MED FILL — Levothyroxine Sodium Tab 75 MCG: ORAL | 90 days supply | Qty: 90 | Fill #1 | Status: AC

## 2020-11-04 MED FILL — Sertraline HCl Tab 50 MG: ORAL | 90 days supply | Qty: 90 | Fill #1 | Status: AC

## 2020-11-06 ENCOUNTER — Other Ambulatory Visit (HOSPITAL_COMMUNITY): Payer: Self-pay

## 2020-11-08 ENCOUNTER — Other Ambulatory Visit (HOSPITAL_COMMUNITY): Payer: Self-pay

## 2020-12-02 MED FILL — Progesterone Cap 100 MG: ORAL | 30 days supply | Qty: 30 | Fill #6 | Status: AC

## 2020-12-03 ENCOUNTER — Other Ambulatory Visit (HOSPITAL_COMMUNITY): Payer: Self-pay

## 2020-12-04 ENCOUNTER — Other Ambulatory Visit (HOSPITAL_COMMUNITY): Payer: Self-pay

## 2020-12-05 ENCOUNTER — Other Ambulatory Visit (HOSPITAL_COMMUNITY): Payer: Self-pay

## 2020-12-14 ENCOUNTER — Other Ambulatory Visit (HOSPITAL_COMMUNITY): Payer: Self-pay

## 2020-12-14 MED ORDER — PSEUDOEPH-BROMPHEN-DM 30-2-10 MG/5ML PO SYRP
ORAL_SOLUTION | ORAL | 0 refills | Status: DC
  Filled 2020-12-14: qty 200, 14d supply, fill #0

## 2020-12-14 MED ORDER — LAGEVRIO 200 MG PO CAPS
ORAL_CAPSULE | ORAL | 0 refills | Status: AC
  Filled 2020-12-14: qty 40, 5d supply, fill #0

## 2020-12-19 ENCOUNTER — Other Ambulatory Visit (HOSPITAL_COMMUNITY): Payer: Self-pay

## 2020-12-19 MED ORDER — AMOXICILLIN-POT CLAVULANATE 875-125 MG PO TABS
ORAL_TABLET | ORAL | 0 refills | Status: DC
  Filled 2020-12-19: qty 14, 7d supply, fill #0

## 2020-12-27 ENCOUNTER — Other Ambulatory Visit (HOSPITAL_COMMUNITY): Payer: Self-pay

## 2020-12-27 DIAGNOSIS — E039 Hypothyroidism, unspecified: Secondary | ICD-10-CM | POA: Diagnosis not present

## 2020-12-27 DIAGNOSIS — Z0000 Encounter for general adult medical examination without abnormal findings: Secondary | ICD-10-CM | POA: Diagnosis not present

## 2020-12-27 DIAGNOSIS — J0110 Acute frontal sinusitis, unspecified: Secondary | ICD-10-CM | POA: Diagnosis not present

## 2020-12-27 MED ORDER — AMOXICILLIN-POT CLAVULANATE 875-125 MG PO TABS
ORAL_TABLET | ORAL | 0 refills | Status: DC
  Filled 2020-12-27: qty 10, 5d supply, fill #0

## 2020-12-27 MED ORDER — FLUTICASONE PROPIONATE 50 MCG/ACT NA SUSP
NASAL | 1 refills | Status: DC
  Filled 2020-12-27: qty 16, 60d supply, fill #0

## 2020-12-30 ENCOUNTER — Other Ambulatory Visit (HOSPITAL_COMMUNITY): Payer: Self-pay

## 2020-12-30 DIAGNOSIS — J0110 Acute frontal sinusitis, unspecified: Secondary | ICD-10-CM | POA: Diagnosis not present

## 2020-12-30 DIAGNOSIS — K5289 Other specified noninfective gastroenteritis and colitis: Secondary | ICD-10-CM | POA: Diagnosis not present

## 2020-12-30 DIAGNOSIS — F5104 Psychophysiologic insomnia: Secondary | ICD-10-CM | POA: Diagnosis not present

## 2020-12-30 DIAGNOSIS — L409 Psoriasis, unspecified: Secondary | ICD-10-CM | POA: Diagnosis not present

## 2020-12-30 DIAGNOSIS — L814 Other melanin hyperpigmentation: Secondary | ICD-10-CM | POA: Diagnosis not present

## 2020-12-30 DIAGNOSIS — E039 Hypothyroidism, unspecified: Secondary | ICD-10-CM | POA: Diagnosis not present

## 2020-12-30 DIAGNOSIS — F419 Anxiety disorder, unspecified: Secondary | ICD-10-CM | POA: Diagnosis not present

## 2020-12-30 DIAGNOSIS — Z0000 Encounter for general adult medical examination without abnormal findings: Secondary | ICD-10-CM | POA: Diagnosis not present

## 2020-12-30 DIAGNOSIS — Z23 Encounter for immunization: Secondary | ICD-10-CM | POA: Diagnosis not present

## 2020-12-30 MED ORDER — PEG 3350-KCL-NA BICARB-NACL 420 G PO SOLR
ORAL | 0 refills | Status: AC
  Filled 2020-12-30: qty 4000, 1d supply, fill #0

## 2020-12-30 MED ORDER — SERTRALINE HCL 50 MG PO TABS
50.0000 mg | ORAL_TABLET | Freq: Every day | ORAL | 3 refills | Status: AC
  Filled 2020-12-30: qty 90, 90d supply, fill #0

## 2020-12-30 MED ORDER — TRETINOIN 0.025 % EX CREA
TOPICAL_CREAM | CUTANEOUS | 5 refills | Status: DC
  Filled 2020-12-30: qty 45, 30d supply, fill #0

## 2020-12-30 MED ORDER — ESZOPICLONE 3 MG PO TABS
ORAL_TABLET | ORAL | 1 refills | Status: DC
  Filled 2020-12-30 – 2021-01-15 (×2): qty 90, 90d supply, fill #0
  Filled 2021-04-14: qty 90, 90d supply, fill #1

## 2020-12-30 MED ORDER — LEVOTHYROXINE SODIUM 75 MCG PO TABS
ORAL_TABLET | ORAL | 3 refills | Status: DC
  Filled 2020-12-30: qty 90, 90d supply, fill #0
  Filled 2021-05-07: qty 90, 90d supply, fill #1
  Filled 2021-08-04: qty 90, 90d supply, fill #2
  Filled 2021-10-30: qty 90, 90d supply, fill #3

## 2020-12-30 MED ORDER — ESZOPICLONE 3 MG PO TABS
3.0000 mg | ORAL_TABLET | Freq: Every day | ORAL | 2 refills | Status: AC
  Filled 2020-12-30: qty 90, 90d supply, fill #0

## 2020-12-30 MED ORDER — CLOBETASOL PROPIONATE 0.05 % EX SOLN
CUTANEOUS | 1 refills | Status: DC
  Filled 2020-12-30: qty 50, 30d supply, fill #0
  Filled 2021-09-03: qty 50, 30d supply, fill #1

## 2020-12-30 MED ORDER — SERTRALINE HCL 50 MG PO TABS
50.0000 mg | ORAL_TABLET | Freq: Every day | ORAL | 3 refills | Status: DC
  Filled 2020-12-30: qty 90, 90d supply, fill #0
  Filled 2021-05-07: qty 90, 90d supply, fill #1
  Filled 2021-08-04: qty 90, 90d supply, fill #2
  Filled 2021-10-30: qty 90, 90d supply, fill #3

## 2020-12-30 MED ORDER — LEVOTHYROXINE SODIUM 75 MCG PO TABS
75.0000 ug | ORAL_TABLET | Freq: Every morning | ORAL | 3 refills | Status: DC
  Filled 2020-12-30: qty 90, 90d supply, fill #0

## 2020-12-30 MED ORDER — FLUTICASONE PROPIONATE 50 MCG/ACT NA SUSP
NASAL | 3 refills | Status: AC
  Filled 2020-12-30: qty 16, 60d supply, fill #0

## 2020-12-31 ENCOUNTER — Other Ambulatory Visit (HOSPITAL_COMMUNITY): Payer: Self-pay

## 2020-12-31 MED ORDER — PROGESTERONE MICRONIZED 100 MG PO CAPS: ORAL_CAPSULE | ORAL | 0 refills | Status: AC

## 2020-12-31 MED ORDER — PROGESTERONE MICRONIZED 100 MG PO CAPS
ORAL_CAPSULE | ORAL | 11 refills | Status: AC
  Filled 2020-12-31: qty 30, 30d supply, fill #0

## 2021-01-01 ENCOUNTER — Other Ambulatory Visit (HOSPITAL_COMMUNITY): Payer: Self-pay

## 2021-01-06 DIAGNOSIS — D120 Benign neoplasm of cecum: Secondary | ICD-10-CM | POA: Diagnosis not present

## 2021-01-06 DIAGNOSIS — K5190 Ulcerative colitis, unspecified, without complications: Secondary | ICD-10-CM | POA: Diagnosis not present

## 2021-01-14 ENCOUNTER — Other Ambulatory Visit: Payer: Self-pay | Admitting: Internal Medicine

## 2021-01-14 DIAGNOSIS — Z0000 Encounter for general adult medical examination without abnormal findings: Secondary | ICD-10-CM

## 2021-01-16 ENCOUNTER — Other Ambulatory Visit (HOSPITAL_COMMUNITY): Payer: Self-pay

## 2021-01-17 ENCOUNTER — Other Ambulatory Visit (HOSPITAL_COMMUNITY): Payer: Self-pay

## 2021-02-03 ENCOUNTER — Other Ambulatory Visit (HOSPITAL_COMMUNITY): Payer: Self-pay

## 2021-02-03 DIAGNOSIS — Z6824 Body mass index (BMI) 24.0-24.9, adult: Secondary | ICD-10-CM | POA: Diagnosis not present

## 2021-02-03 DIAGNOSIS — Z1231 Encounter for screening mammogram for malignant neoplasm of breast: Secondary | ICD-10-CM | POA: Diagnosis not present

## 2021-02-03 DIAGNOSIS — Z01419 Encounter for gynecological examination (general) (routine) without abnormal findings: Secondary | ICD-10-CM | POA: Diagnosis not present

## 2021-02-03 DIAGNOSIS — Z79890 Hormone replacement therapy (postmenopausal): Secondary | ICD-10-CM | POA: Diagnosis not present

## 2021-02-03 MED ORDER — ESTRADIOL 0.75 MG/0.75GM TD GEL
TRANSDERMAL | 11 refills | Status: DC
  Filled 2021-02-03: qty 30, 30d supply, fill #0
  Filled 2021-03-05: qty 30, 30d supply, fill #1
  Filled 2021-04-14: qty 30, 30d supply, fill #2
  Filled 2021-05-11: qty 30, 30d supply, fill #3
  Filled 2021-06-12: qty 30, 30d supply, fill #4
  Filled 2021-07-12: qty 30, 30d supply, fill #5
  Filled 2021-08-12: qty 30, 30d supply, fill #6
  Filled 2021-09-11: qty 30, 30d supply, fill #7
  Filled 2021-10-10: qty 30, 30d supply, fill #8
  Filled 2021-11-17: qty 30, 30d supply, fill #9
  Filled 2021-12-15: qty 30, 30d supply, fill #10
  Filled 2022-01-19: qty 30, 30d supply, fill #11

## 2021-02-03 MED ORDER — PROGESTERONE MICRONIZED 100 MG PO CAPS
ORAL_CAPSULE | ORAL | 11 refills | Status: DC
  Filled 2021-02-03: qty 30, 30d supply, fill #0
  Filled 2021-03-05: qty 30, 30d supply, fill #1
  Filled 2021-06-03: qty 30, 30d supply, fill #2
  Filled 2021-07-05: qty 30, 30d supply, fill #3
  Filled 2021-08-04: qty 30, 30d supply, fill #4
  Filled 2021-09-01: qty 30, 30d supply, fill #5
  Filled 2021-10-02: qty 30, 30d supply, fill #6
  Filled 2021-10-30: qty 30, 30d supply, fill #7
  Filled 2021-11-30: qty 30, 30d supply, fill #8
  Filled 2021-12-27: qty 30, 30d supply, fill #9

## 2021-02-04 ENCOUNTER — Other Ambulatory Visit (HOSPITAL_COMMUNITY): Payer: Self-pay

## 2021-02-07 ENCOUNTER — Ambulatory Visit
Admission: RE | Admit: 2021-02-07 | Discharge: 2021-02-07 | Disposition: A | Discharge status: Home / Self Care | Payer: No Typology Code available for payment source | Source: Ambulatory Visit | Attending: Internal Medicine | Admitting: Internal Medicine

## 2021-02-07 DIAGNOSIS — Z0000 Encounter for general adult medical examination without abnormal findings: Secondary | ICD-10-CM

## 2021-03-06 ENCOUNTER — Other Ambulatory Visit (HOSPITAL_COMMUNITY): Payer: Self-pay

## 2021-03-07 ENCOUNTER — Other Ambulatory Visit (HOSPITAL_COMMUNITY): Payer: Self-pay

## 2021-03-14 DIAGNOSIS — H52203 Unspecified astigmatism, bilateral: Secondary | ICD-10-CM | POA: Diagnosis not present

## 2021-03-14 DIAGNOSIS — H5203 Hypermetropia, bilateral: Secondary | ICD-10-CM | POA: Diagnosis not present

## 2021-04-15 ENCOUNTER — Other Ambulatory Visit (HOSPITAL_COMMUNITY): Payer: Self-pay

## 2021-04-16 ENCOUNTER — Other Ambulatory Visit (HOSPITAL_COMMUNITY): Payer: Self-pay

## 2021-05-08 ENCOUNTER — Other Ambulatory Visit (HOSPITAL_COMMUNITY): Payer: Self-pay

## 2021-05-12 ENCOUNTER — Other Ambulatory Visit (HOSPITAL_COMMUNITY): Payer: Self-pay

## 2021-05-13 ENCOUNTER — Other Ambulatory Visit (HOSPITAL_COMMUNITY): Payer: Self-pay

## 2021-06-04 ENCOUNTER — Other Ambulatory Visit (HOSPITAL_COMMUNITY): Payer: Self-pay

## 2021-06-12 ENCOUNTER — Other Ambulatory Visit (HOSPITAL_COMMUNITY): Payer: Self-pay

## 2021-06-16 ENCOUNTER — Other Ambulatory Visit (HOSPITAL_COMMUNITY): Payer: Self-pay

## 2021-07-05 ENCOUNTER — Other Ambulatory Visit (HOSPITAL_COMMUNITY): Payer: Self-pay

## 2021-07-12 ENCOUNTER — Other Ambulatory Visit (HOSPITAL_COMMUNITY): Payer: Self-pay

## 2021-07-14 ENCOUNTER — Other Ambulatory Visit (HOSPITAL_COMMUNITY): Payer: Self-pay

## 2021-07-14 MED ORDER — ESZOPICLONE 3 MG PO TABS
ORAL_TABLET | ORAL | 2 refills | Status: DC
  Filled 2021-07-14: qty 90, 90d supply, fill #0
  Filled 2021-10-10: qty 90, 90d supply, fill #1

## 2021-07-15 ENCOUNTER — Other Ambulatory Visit (HOSPITAL_COMMUNITY): Payer: Self-pay

## 2021-08-04 ENCOUNTER — Other Ambulatory Visit (HOSPITAL_COMMUNITY): Payer: Self-pay

## 2021-08-13 ENCOUNTER — Other Ambulatory Visit (HOSPITAL_COMMUNITY): Payer: Self-pay

## 2021-08-14 ENCOUNTER — Other Ambulatory Visit (HOSPITAL_COMMUNITY): Payer: Self-pay

## 2021-09-01 ENCOUNTER — Other Ambulatory Visit (HOSPITAL_COMMUNITY): Payer: Self-pay

## 2021-09-03 ENCOUNTER — Other Ambulatory Visit (HOSPITAL_COMMUNITY): Payer: Self-pay

## 2021-09-05 ENCOUNTER — Other Ambulatory Visit (HOSPITAL_COMMUNITY): Payer: Self-pay

## 2021-09-11 ENCOUNTER — Other Ambulatory Visit (HOSPITAL_COMMUNITY): Payer: Self-pay

## 2021-09-12 ENCOUNTER — Other Ambulatory Visit (HOSPITAL_COMMUNITY): Payer: Self-pay

## 2021-10-02 ENCOUNTER — Other Ambulatory Visit (HOSPITAL_COMMUNITY): Payer: Self-pay

## 2021-10-10 ENCOUNTER — Other Ambulatory Visit (HOSPITAL_COMMUNITY): Payer: Self-pay

## 2021-10-14 ENCOUNTER — Other Ambulatory Visit (HOSPITAL_COMMUNITY): Payer: Self-pay

## 2021-10-31 ENCOUNTER — Other Ambulatory Visit (HOSPITAL_COMMUNITY): Payer: Self-pay

## 2021-11-04 ENCOUNTER — Other Ambulatory Visit (HOSPITAL_COMMUNITY): Payer: Self-pay

## 2021-11-17 ENCOUNTER — Other Ambulatory Visit (HOSPITAL_COMMUNITY): Payer: Self-pay

## 2021-11-18 ENCOUNTER — Other Ambulatory Visit (HOSPITAL_COMMUNITY): Payer: Self-pay

## 2021-12-01 ENCOUNTER — Other Ambulatory Visit (HOSPITAL_COMMUNITY): Payer: Self-pay

## 2021-12-16 ENCOUNTER — Encounter (HOSPITAL_COMMUNITY): Payer: Self-pay | Admitting: Pharmacist

## 2021-12-16 ENCOUNTER — Other Ambulatory Visit (HOSPITAL_COMMUNITY): Payer: Self-pay

## 2021-12-17 ENCOUNTER — Other Ambulatory Visit (HOSPITAL_COMMUNITY): Payer: Self-pay

## 2021-12-26 DIAGNOSIS — M25561 Pain in right knee: Secondary | ICD-10-CM | POA: Diagnosis not present

## 2021-12-27 ENCOUNTER — Other Ambulatory Visit (HOSPITAL_COMMUNITY): Payer: Self-pay

## 2022-01-01 ENCOUNTER — Other Ambulatory Visit (HOSPITAL_COMMUNITY): Payer: Self-pay

## 2022-01-02 ENCOUNTER — Other Ambulatory Visit (HOSPITAL_COMMUNITY): Payer: Self-pay

## 2022-01-02 DIAGNOSIS — Z0000 Encounter for general adult medical examination without abnormal findings: Secondary | ICD-10-CM | POA: Diagnosis not present

## 2022-01-02 DIAGNOSIS — M2550 Pain in unspecified joint: Secondary | ICD-10-CM | POA: Diagnosis not present

## 2022-01-02 DIAGNOSIS — E039 Hypothyroidism, unspecified: Secondary | ICD-10-CM | POA: Diagnosis not present

## 2022-01-05 ENCOUNTER — Other Ambulatory Visit (HOSPITAL_COMMUNITY): Payer: Self-pay

## 2022-01-05 DIAGNOSIS — E039 Hypothyroidism, unspecified: Secondary | ICD-10-CM | POA: Diagnosis not present

## 2022-01-05 DIAGNOSIS — F419 Anxiety disorder, unspecified: Secondary | ICD-10-CM | POA: Diagnosis not present

## 2022-01-05 DIAGNOSIS — K5289 Other specified noninfective gastroenteritis and colitis: Secondary | ICD-10-CM | POA: Diagnosis not present

## 2022-01-05 DIAGNOSIS — Z0000 Encounter for general adult medical examination without abnormal findings: Secondary | ICD-10-CM | POA: Diagnosis not present

## 2022-01-05 DIAGNOSIS — L4050 Arthropathic psoriasis, unspecified: Secondary | ICD-10-CM | POA: Diagnosis not present

## 2022-01-05 DIAGNOSIS — R221 Localized swelling, mass and lump, neck: Secondary | ICD-10-CM | POA: Diagnosis not present

## 2022-01-05 DIAGNOSIS — Z23 Encounter for immunization: Secondary | ICD-10-CM | POA: Diagnosis not present

## 2022-01-05 DIAGNOSIS — F5104 Psychophysiologic insomnia: Secondary | ICD-10-CM | POA: Diagnosis not present

## 2022-01-13 ENCOUNTER — Other Ambulatory Visit (HOSPITAL_COMMUNITY): Payer: Self-pay

## 2022-01-14 ENCOUNTER — Other Ambulatory Visit (HOSPITAL_COMMUNITY): Payer: Self-pay

## 2022-01-14 MED ORDER — ESZOPICLONE 3 MG PO TABS
3.0000 mg | ORAL_TABLET | Freq: Every day | ORAL | 2 refills | Status: DC
  Filled 2022-01-14: qty 90, 90d supply, fill #0
  Filled 2022-04-10 – 2022-04-15 (×2): qty 90, 90d supply, fill #1

## 2022-01-19 ENCOUNTER — Other Ambulatory Visit (HOSPITAL_COMMUNITY): Payer: Self-pay

## 2022-01-20 ENCOUNTER — Other Ambulatory Visit (HOSPITAL_COMMUNITY): Payer: Self-pay

## 2022-01-26 ENCOUNTER — Other Ambulatory Visit (HOSPITAL_COMMUNITY): Payer: Self-pay

## 2022-01-26 ENCOUNTER — Other Ambulatory Visit (HOSPITAL_BASED_OUTPATIENT_CLINIC_OR_DEPARTMENT_OTHER): Payer: Self-pay

## 2022-01-26 MED ORDER — SERTRALINE HCL 50 MG PO TABS
50.0000 mg | ORAL_TABLET | Freq: Every day | ORAL | 3 refills | Status: DC
  Filled 2022-01-26: qty 90, 90d supply, fill #0
  Filled 2022-04-23 – 2022-04-24 (×2): qty 90, 90d supply, fill #1
  Filled 2022-07-23 – 2022-07-30 (×2): qty 90, 90d supply, fill #2
  Filled 2022-10-24 (×2): qty 90, 90d supply, fill #3

## 2022-01-26 MED ORDER — LEVOTHYROXINE SODIUM 75 MCG PO TABS
75.0000 ug | ORAL_TABLET | Freq: Every day | ORAL | 3 refills | Status: DC
  Filled 2022-01-26: qty 90, 90d supply, fill #0
  Filled 2022-04-23 – 2022-04-24 (×2): qty 90, 90d supply, fill #1
  Filled 2022-07-23 – 2022-07-30 (×2): qty 90, 90d supply, fill #2
  Filled 2022-10-24 (×2): qty 90, 90d supply, fill #3

## 2022-01-30 ENCOUNTER — Other Ambulatory Visit (HOSPITAL_COMMUNITY): Payer: Self-pay

## 2022-02-03 ENCOUNTER — Other Ambulatory Visit (HOSPITAL_COMMUNITY): Payer: Self-pay

## 2022-02-03 MED ORDER — PROGESTERONE MICRONIZED 100 MG PO CAPS
100.0000 mg | ORAL_CAPSULE | Freq: Every evening | ORAL | 1 refills | Status: DC
  Filled 2022-02-03: qty 30, 30d supply, fill #0

## 2022-02-13 ENCOUNTER — Other Ambulatory Visit (HOSPITAL_COMMUNITY): Payer: Self-pay

## 2022-02-16 ENCOUNTER — Other Ambulatory Visit (HOSPITAL_COMMUNITY): Payer: Self-pay

## 2022-02-16 ENCOUNTER — Other Ambulatory Visit: Payer: Self-pay

## 2022-02-16 DIAGNOSIS — Z01419 Encounter for gynecological examination (general) (routine) without abnormal findings: Secondary | ICD-10-CM | POA: Diagnosis not present

## 2022-02-16 DIAGNOSIS — Z13820 Encounter for screening for osteoporosis: Secondary | ICD-10-CM | POA: Diagnosis not present

## 2022-02-16 DIAGNOSIS — Z1231 Encounter for screening mammogram for malignant neoplasm of breast: Secondary | ICD-10-CM | POA: Diagnosis not present

## 2022-02-16 DIAGNOSIS — Z1151 Encounter for screening for human papillomavirus (HPV): Secondary | ICD-10-CM | POA: Diagnosis not present

## 2022-02-16 DIAGNOSIS — Z6824 Body mass index (BMI) 24.0-24.9, adult: Secondary | ICD-10-CM | POA: Diagnosis not present

## 2022-02-16 DIAGNOSIS — Z124 Encounter for screening for malignant neoplasm of cervix: Secondary | ICD-10-CM | POA: Diagnosis not present

## 2022-02-16 MED ORDER — PROGESTERONE MICRONIZED 100 MG PO CAPS
100.0000 mg | ORAL_CAPSULE | Freq: Every day | ORAL | 4 refills | Status: DC
  Filled 2022-02-16 – 2022-03-05 (×3): qty 90, 90d supply, fill #0
  Filled 2022-05-27: qty 90, 90d supply, fill #1
  Filled 2022-08-24: qty 90, 90d supply, fill #2
  Filled 2022-11-24: qty 90, 90d supply, fill #3

## 2022-02-16 MED ORDER — ESTRADIOL 0.75 MG/0.75GM TD GEL
1.0000 "application " | Freq: Every day | TRANSDERMAL | 4 refills | Status: DC
  Filled 2022-02-16: qty 90, 90d supply, fill #0
  Filled 2022-05-15: qty 90, 90d supply, fill #1
  Filled 2022-08-26: qty 90, 90d supply, fill #2
  Filled 2022-11-24: qty 90, 90d supply, fill #3

## 2022-02-16 MED ORDER — CLOBETASOL PROPIONATE 0.05 % EX SOLN
CUTANEOUS | 1 refills | Status: DC
  Filled 2022-02-16: qty 50, 30d supply, fill #0
  Filled 2022-08-24: qty 50, 30d supply, fill #1

## 2022-02-17 ENCOUNTER — Other Ambulatory Visit (HOSPITAL_COMMUNITY): Payer: Self-pay

## 2022-03-04 ENCOUNTER — Other Ambulatory Visit (HOSPITAL_COMMUNITY): Payer: Self-pay

## 2022-03-05 ENCOUNTER — Other Ambulatory Visit: Payer: Self-pay

## 2022-03-05 ENCOUNTER — Other Ambulatory Visit (HOSPITAL_COMMUNITY): Payer: Self-pay

## 2022-03-06 ENCOUNTER — Other Ambulatory Visit (HOSPITAL_COMMUNITY): Payer: Self-pay

## 2022-03-23 DIAGNOSIS — H5213 Myopia, bilateral: Secondary | ICD-10-CM | POA: Diagnosis not present

## 2022-04-10 ENCOUNTER — Other Ambulatory Visit (HOSPITAL_COMMUNITY): Payer: Self-pay

## 2022-04-16 ENCOUNTER — Other Ambulatory Visit (HOSPITAL_COMMUNITY): Payer: Self-pay

## 2022-04-16 ENCOUNTER — Other Ambulatory Visit: Payer: Self-pay

## 2022-04-17 ENCOUNTER — Other Ambulatory Visit (HOSPITAL_COMMUNITY): Payer: Self-pay

## 2022-04-18 ENCOUNTER — Other Ambulatory Visit (HOSPITAL_COMMUNITY): Payer: Self-pay

## 2022-04-24 ENCOUNTER — Other Ambulatory Visit (HOSPITAL_COMMUNITY): Payer: Self-pay

## 2022-05-15 ENCOUNTER — Other Ambulatory Visit (HOSPITAL_COMMUNITY): Payer: Self-pay

## 2022-05-28 ENCOUNTER — Other Ambulatory Visit (HOSPITAL_COMMUNITY): Payer: Self-pay

## 2022-07-14 ENCOUNTER — Other Ambulatory Visit (HOSPITAL_COMMUNITY): Payer: Self-pay

## 2022-07-15 ENCOUNTER — Other Ambulatory Visit (HOSPITAL_COMMUNITY): Payer: Self-pay

## 2022-07-15 MED ORDER — ESZOPICLONE 3 MG PO TABS
3.0000 mg | ORAL_TABLET | Freq: Every evening | ORAL | 2 refills | Status: DC
  Filled 2022-07-15: qty 90, 90d supply, fill #0
  Filled 2022-10-13: qty 40, 40d supply, fill #1
  Filled 2022-10-14: qty 50, 50d supply, fill #1
  Filled 2022-11-24 – 2023-01-09 (×3): qty 90, 90d supply, fill #2

## 2022-07-24 ENCOUNTER — Other Ambulatory Visit (HOSPITAL_COMMUNITY): Payer: Self-pay

## 2022-07-24 ENCOUNTER — Encounter: Payer: Self-pay | Admitting: Pharmacist

## 2022-07-24 ENCOUNTER — Other Ambulatory Visit: Payer: Self-pay

## 2022-07-28 ENCOUNTER — Other Ambulatory Visit (HOSPITAL_COMMUNITY): Payer: Self-pay

## 2022-07-30 ENCOUNTER — Other Ambulatory Visit (HOSPITAL_COMMUNITY): Payer: Self-pay

## 2022-07-30 ENCOUNTER — Other Ambulatory Visit: Payer: Self-pay

## 2022-08-07 ENCOUNTER — Other Ambulatory Visit (HOSPITAL_COMMUNITY): Payer: Self-pay

## 2022-08-25 ENCOUNTER — Other Ambulatory Visit: Payer: Self-pay

## 2022-08-27 ENCOUNTER — Other Ambulatory Visit (HOSPITAL_COMMUNITY): Payer: Self-pay

## 2022-10-13 ENCOUNTER — Other Ambulatory Visit: Payer: Self-pay

## 2022-10-14 ENCOUNTER — Other Ambulatory Visit (HOSPITAL_COMMUNITY): Payer: Self-pay

## 2022-10-14 ENCOUNTER — Other Ambulatory Visit: Payer: Self-pay

## 2022-10-24 ENCOUNTER — Other Ambulatory Visit (HOSPITAL_COMMUNITY): Payer: Self-pay

## 2022-10-27 ENCOUNTER — Other Ambulatory Visit (HOSPITAL_COMMUNITY): Payer: Self-pay

## 2022-11-13 ENCOUNTER — Other Ambulatory Visit (HOSPITAL_COMMUNITY): Payer: Self-pay

## 2022-11-16 ENCOUNTER — Other Ambulatory Visit (HOSPITAL_COMMUNITY): Payer: Self-pay

## 2022-11-16 MED ORDER — CLOBETASOL PROPIONATE 0.05 % EX SOLN
1.0000 | Freq: Every day | CUTANEOUS | 1 refills | Status: DC
  Filled 2022-11-16: qty 50, 30d supply, fill #0
  Filled 2023-01-04: qty 50, 30d supply, fill #1

## 2022-11-24 ENCOUNTER — Other Ambulatory Visit: Payer: Self-pay

## 2022-11-24 ENCOUNTER — Other Ambulatory Visit (HOSPITAL_COMMUNITY): Payer: Self-pay

## 2022-11-28 ENCOUNTER — Other Ambulatory Visit (HOSPITAL_COMMUNITY): Payer: Self-pay

## 2022-11-30 ENCOUNTER — Other Ambulatory Visit (HOSPITAL_COMMUNITY): Payer: Self-pay

## 2022-11-30 MED ORDER — COVID-19 MRNA VAC-TRIS(PFIZER) 30 MCG/0.3ML IM SUSY
0.3000 mL | PREFILLED_SYRINGE | Freq: Once | INTRAMUSCULAR | 0 refills | Status: AC
  Filled 2022-11-30: qty 0.3, 1d supply, fill #0

## 2022-11-30 MED ORDER — ESTRADIOL 0.75 MG/0.75GM TD GEL: TRANSDERMAL | 1 refills | Status: DC

## 2022-12-02 ENCOUNTER — Other Ambulatory Visit (HOSPITAL_COMMUNITY): Payer: Self-pay

## 2022-12-02 MED ORDER — ESTRADIOL 0.05 MG/24HR TD PTTW
1.0000 | MEDICATED_PATCH | TRANSDERMAL | 3 refills | Status: DC
  Filled 2022-12-02: qty 24, 84d supply, fill #0

## 2022-12-03 ENCOUNTER — Other Ambulatory Visit (HOSPITAL_COMMUNITY): Payer: Self-pay

## 2022-12-04 ENCOUNTER — Other Ambulatory Visit (HOSPITAL_COMMUNITY): Payer: Self-pay

## 2022-12-04 MED ORDER — INFLUENZA VIRUS VACC SPLIT PF (FLUZONE) 0.5 ML IM SUSY
0.5000 mL | PREFILLED_SYRINGE | Freq: Once | INTRAMUSCULAR | 0 refills | Status: AC
  Filled 2022-12-04: qty 0.5, 1d supply, fill #0

## 2022-12-16 ENCOUNTER — Other Ambulatory Visit (HOSPITAL_COMMUNITY): Payer: Self-pay

## 2022-12-23 ENCOUNTER — Other Ambulatory Visit (HOSPITAL_COMMUNITY): Payer: Self-pay

## 2023-01-04 ENCOUNTER — Other Ambulatory Visit (HOSPITAL_COMMUNITY): Payer: Self-pay

## 2023-01-06 ENCOUNTER — Other Ambulatory Visit (HOSPITAL_COMMUNITY): Payer: Self-pay

## 2023-01-06 MED ORDER — AMOXICILLIN-POT CLAVULANATE 875-125 MG PO TABS
1.0000 | ORAL_TABLET | Freq: Two times a day (BID) | ORAL | 0 refills | Status: AC
  Filled 2023-01-06: qty 14, 7d supply, fill #0

## 2023-01-08 DIAGNOSIS — Z0000 Encounter for general adult medical examination without abnormal findings: Secondary | ICD-10-CM | POA: Diagnosis not present

## 2023-01-08 DIAGNOSIS — E039 Hypothyroidism, unspecified: Secondary | ICD-10-CM | POA: Diagnosis not present

## 2023-01-09 ENCOUNTER — Other Ambulatory Visit (HOSPITAL_COMMUNITY): Payer: Self-pay

## 2023-01-11 ENCOUNTER — Other Ambulatory Visit (HOSPITAL_BASED_OUTPATIENT_CLINIC_OR_DEPARTMENT_OTHER): Payer: Self-pay

## 2023-01-11 DIAGNOSIS — F5104 Psychophysiologic insomnia: Secondary | ICD-10-CM | POA: Diagnosis not present

## 2023-01-11 DIAGNOSIS — L409 Psoriasis, unspecified: Secondary | ICD-10-CM | POA: Diagnosis not present

## 2023-01-11 DIAGNOSIS — Z0000 Encounter for general adult medical examination without abnormal findings: Secondary | ICD-10-CM | POA: Diagnosis not present

## 2023-01-11 DIAGNOSIS — E039 Hypothyroidism, unspecified: Secondary | ICD-10-CM | POA: Diagnosis not present

## 2023-01-21 ENCOUNTER — Other Ambulatory Visit (HOSPITAL_BASED_OUTPATIENT_CLINIC_OR_DEPARTMENT_OTHER): Payer: Self-pay

## 2023-01-21 MED ORDER — SERTRALINE HCL 50 MG PO TABS
50.0000 mg | ORAL_TABLET | Freq: Every day | ORAL | 3 refills | Status: AC
  Filled 2023-01-21: qty 90, 90d supply, fill #0
  Filled 2023-05-22 (×2): qty 90, 90d supply, fill #1

## 2023-01-21 MED ORDER — LEVOTHYROXINE SODIUM 75 MCG PO TABS
75.0000 ug | ORAL_TABLET | Freq: Every day | ORAL | 3 refills | Status: DC
  Filled 2023-01-21: qty 90, 90d supply, fill #0

## 2023-01-22 ENCOUNTER — Other Ambulatory Visit (HOSPITAL_COMMUNITY): Payer: Self-pay

## 2023-01-22 ENCOUNTER — Other Ambulatory Visit (HOSPITAL_BASED_OUTPATIENT_CLINIC_OR_DEPARTMENT_OTHER): Payer: Self-pay

## 2023-01-22 MED ORDER — AMOXICILLIN-POT CLAVULANATE 875-125 MG PO TABS
1.0000 | ORAL_TABLET | Freq: Two times a day (BID) | ORAL | 0 refills | Status: DC
  Filled 2023-01-22 (×2): qty 14, 7d supply, fill #0

## 2023-02-09 ENCOUNTER — Other Ambulatory Visit: Payer: Self-pay | Admitting: Internal Medicine

## 2023-02-09 DIAGNOSIS — J329 Chronic sinusitis, unspecified: Secondary | ICD-10-CM

## 2023-02-15 IMAGING — CT CT CARDIAC CORONARY ARTERY CALCIUM SCORE
3 series · 13 of 20 positions shown, 15 images · non-contrast
Comparison: None.

CLINICAL DATA: 60-year-old Caucasian female with history of
hyperlipidemia and family history of heart disease.

EXAM:
CT CARDIAC CORONARY ARTERY CALCIUM SCORE
TECHNIQUE: Non-contrast imaging through the heart was performed using
prospective ECG gating. Image post processing was performed on an
independent workstation, allowing for quantitative analysis of the
heart and coronary arteries. Note that this exam targets the heart
and the chest was not imaged in its entirety.

[Series 3: calcium scoring 2.00 qr36 bestdiast 70% hrt calciu · axial · 0.35mm/px · z∈[+1686,+1750]mm · 3 of 80 slices shown]
[im 16/80  vessel]
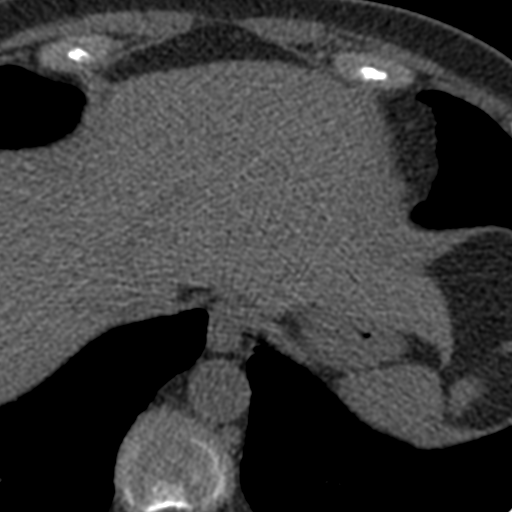
[im 32/80  vessel]
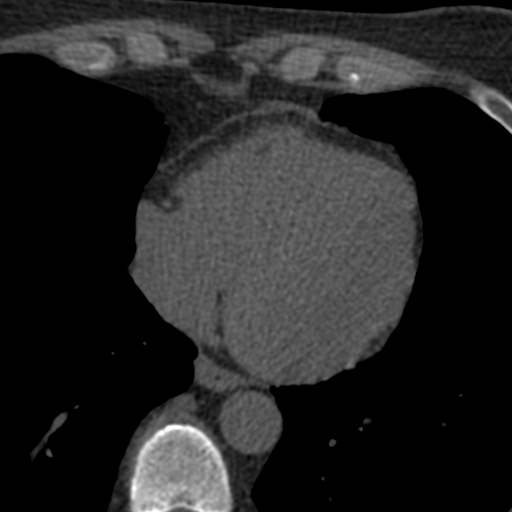
[im 48/80  vessel]
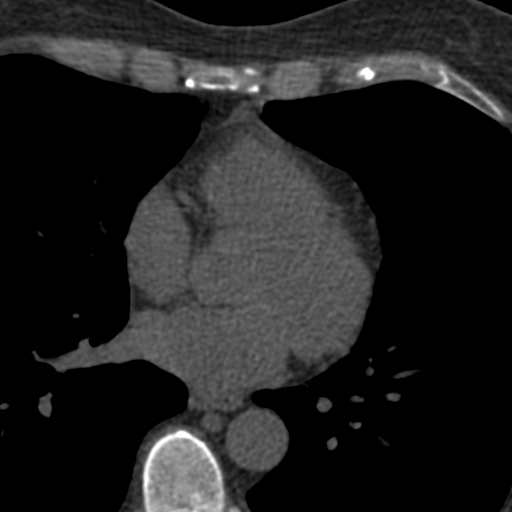

[Series 4: calcium scoring 2.00 br40 bestdiast 70% axial · axial · 0.51mm/px · z∈[+1682,+1786]mm · 5 of 80 slices shown, 7 images]
[im 14/80  vessel]
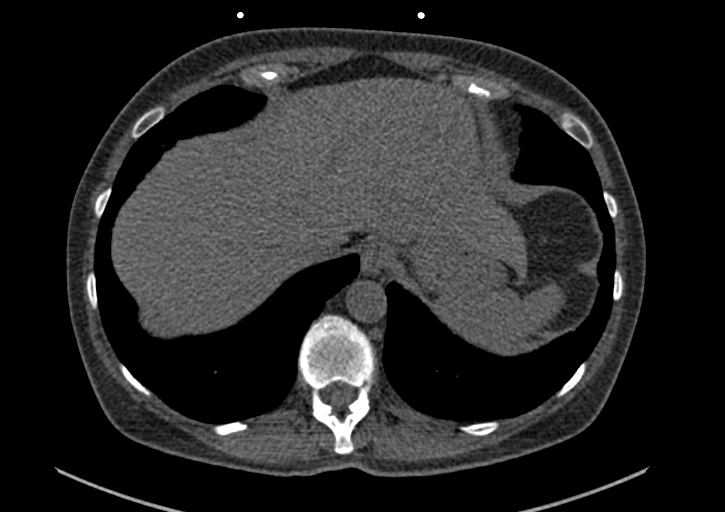
[im 14/80  lung]
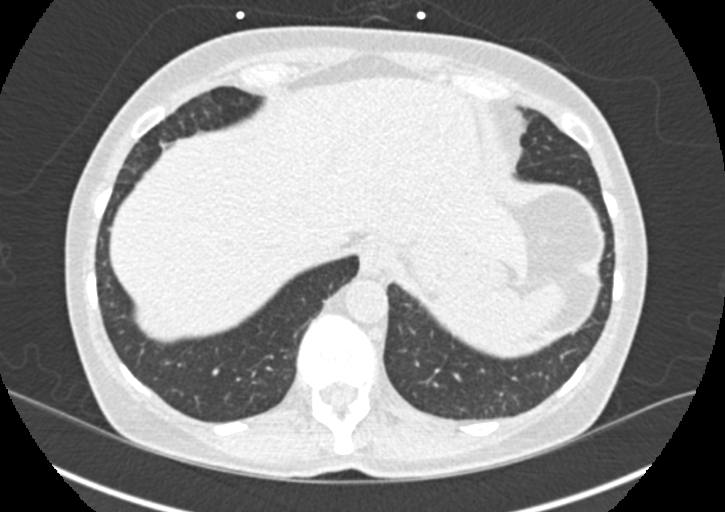
[im 27/80  vessel]
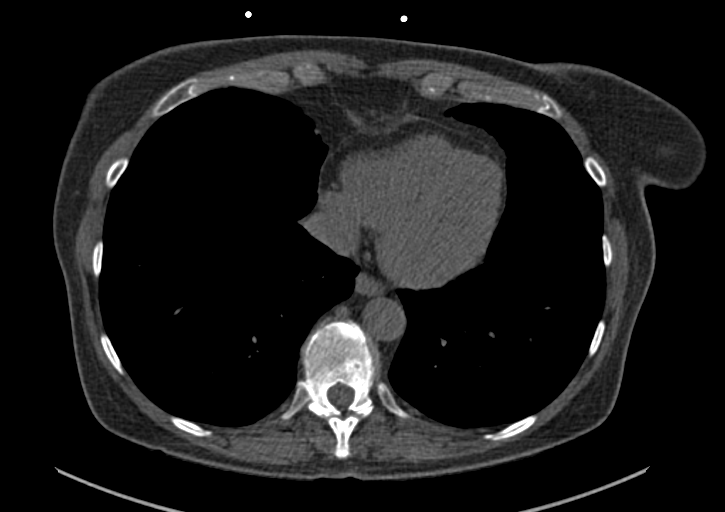
[im 40/80  vessel]
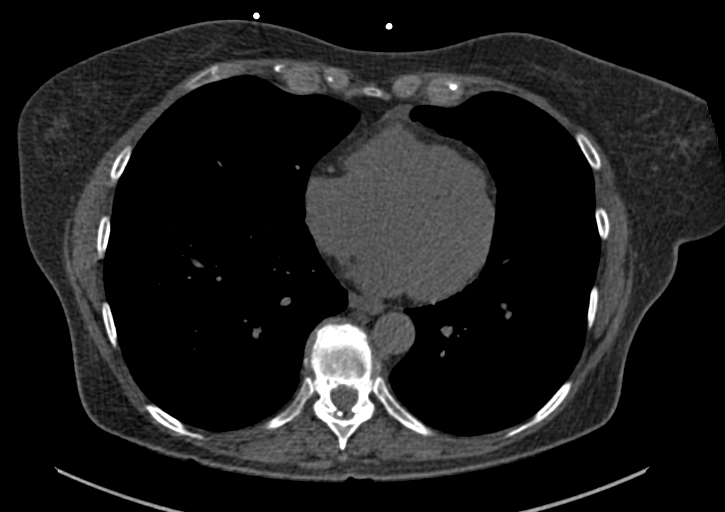
[im 53/80  vessel]
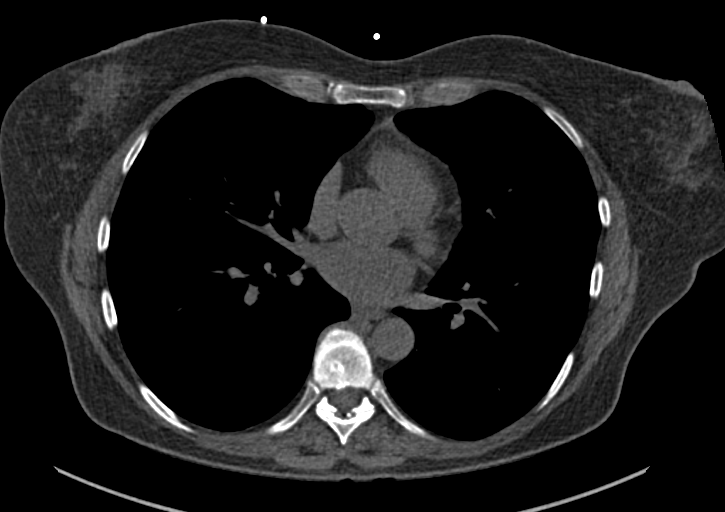
[im 66/80  vessel]
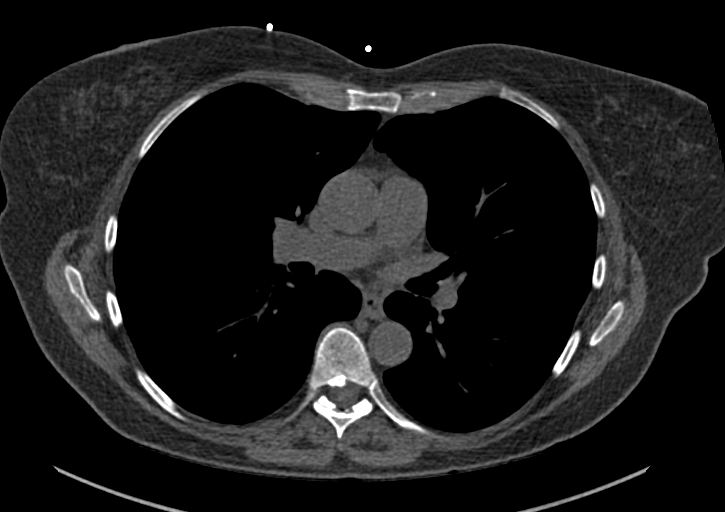
[im 66/80  lung]
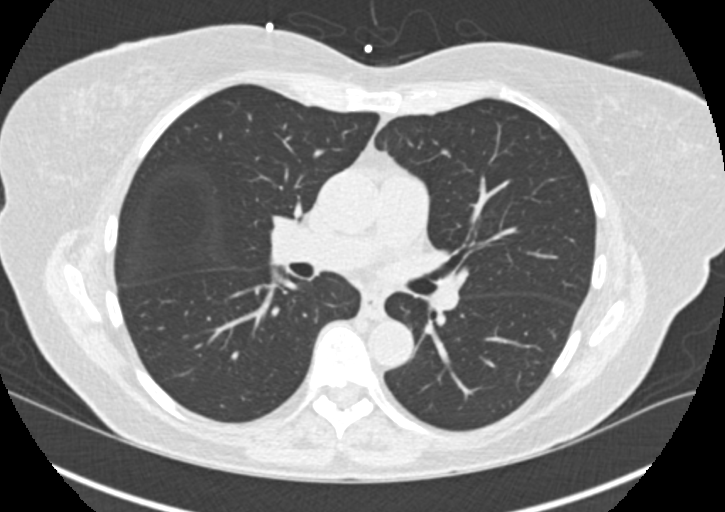

[Series 10: calcium scoring 2.00 br60 bestdiast 70% lungs · axial · 0.51mm/px · z∈[+1682,+1786]mm · 5 of 80 slices shown]
[im 14/80  vessel]
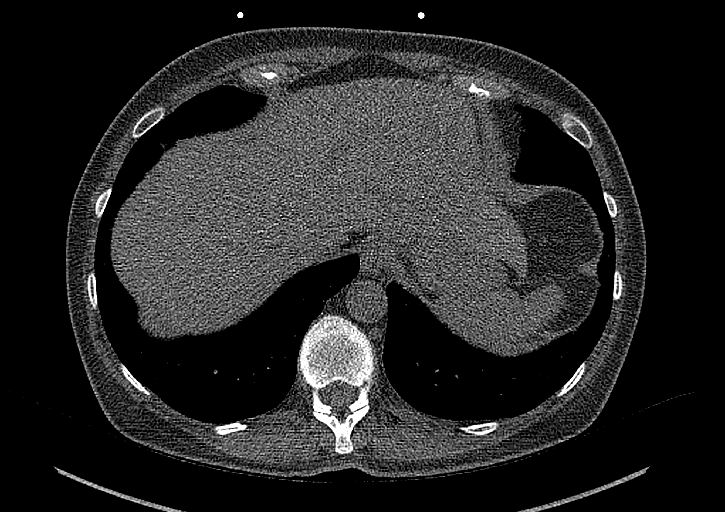
[im 27/80  vessel]
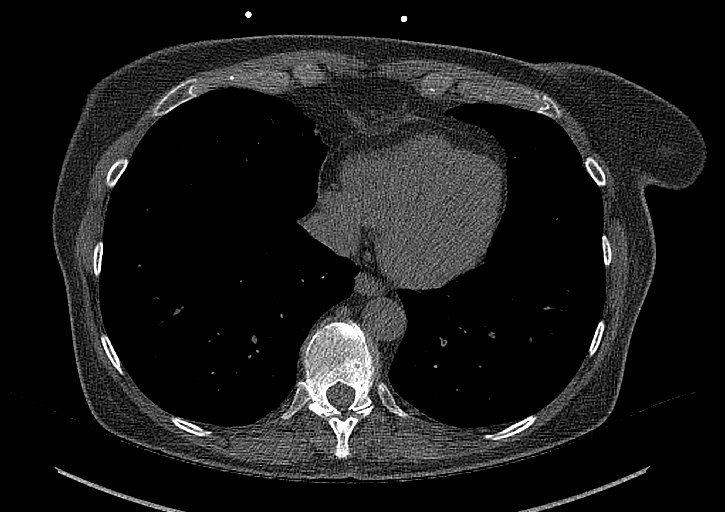
[im 40/80  vessel]
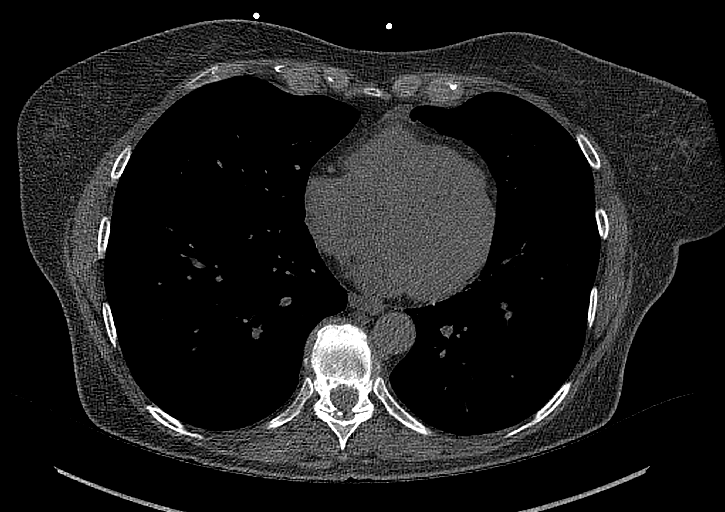
[im 53/80  vessel]
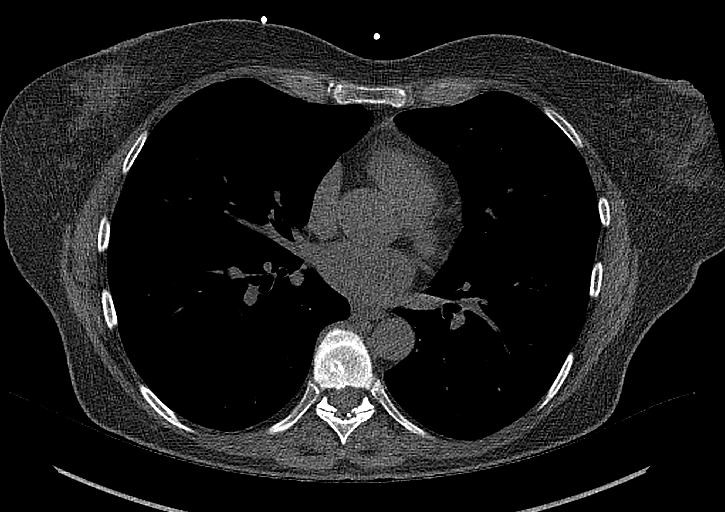
[im 66/80  vessel]
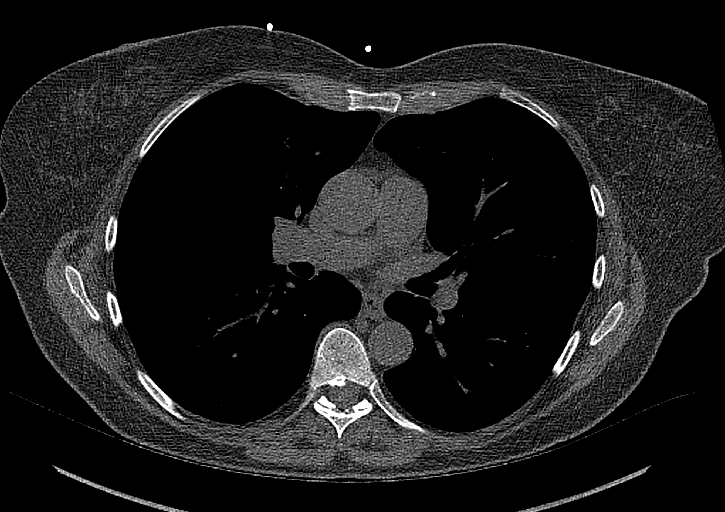

[13 of 20 positions shown; findings below may reference images not displayed]

FINDINGS: CORONARY CALCIUM SCORES:

Left Main: 0

LAD: 0

LCx: 0

RCA: 0

Total Agatston Score: 0

[HOSPITAL] percentile: 0

AORTA MEASUREMENTS:

Ascending Aorta: 31 mm

Descending Aorta: 22 mm

OTHER FINDINGS:

The heart size is within normal limits. No pericardial fluid is
identified. Visualized segments of the thoracic aorta and central
pulmonary arteries are normal in caliber. Visualized mediastinum and
hilar regions demonstrate no lymphadenopathy or masses. Visualized
lungs show no evidence of pulmonary edema, consolidation,
pneumothorax, nodule or pleural fluid. Three small hypodensities of
the liver measuring approximately 8-9 mm are likely consistent with
cysts or biliary hamartomas. Visualized bony structures are
unremarkable.
IMPRESSION: Coronary calcium score of 0.

## 2023-02-20 ENCOUNTER — Other Ambulatory Visit (HOSPITAL_BASED_OUTPATIENT_CLINIC_OR_DEPARTMENT_OTHER): Payer: Self-pay

## 2023-02-21 ENCOUNTER — Other Ambulatory Visit (HOSPITAL_BASED_OUTPATIENT_CLINIC_OR_DEPARTMENT_OTHER): Payer: Self-pay

## 2023-02-22 ENCOUNTER — Other Ambulatory Visit (HOSPITAL_BASED_OUTPATIENT_CLINIC_OR_DEPARTMENT_OTHER): Payer: Self-pay

## 2023-02-22 MED ORDER — PROGESTERONE MICRONIZED 100 MG PO CAPS
100.0000 mg | ORAL_CAPSULE | Freq: Every day | ORAL | 0 refills | Status: DC
  Filled 2023-02-22: qty 90, 90d supply, fill #0

## 2023-02-26 ENCOUNTER — Encounter (INDEPENDENT_AMBULATORY_CARE_PROVIDER_SITE_OTHER): Payer: Self-pay | Admitting: Otolaryngology

## 2023-02-26 ENCOUNTER — Other Ambulatory Visit (HOSPITAL_COMMUNITY): Payer: Self-pay

## 2023-02-27 ENCOUNTER — Other Ambulatory Visit (HOSPITAL_BASED_OUTPATIENT_CLINIC_OR_DEPARTMENT_OTHER): Payer: Self-pay

## 2023-03-08 ENCOUNTER — Other Ambulatory Visit: Payer: Self-pay

## 2023-03-08 ENCOUNTER — Other Ambulatory Visit (HOSPITAL_BASED_OUTPATIENT_CLINIC_OR_DEPARTMENT_OTHER): Payer: Self-pay

## 2023-03-08 DIAGNOSIS — Z1231 Encounter for screening mammogram for malignant neoplasm of breast: Secondary | ICD-10-CM | POA: Diagnosis not present

## 2023-03-08 DIAGNOSIS — Z01419 Encounter for gynecological examination (general) (routine) without abnormal findings: Secondary | ICD-10-CM | POA: Diagnosis not present

## 2023-03-08 DIAGNOSIS — Z1151 Encounter for screening for human papillomavirus (HPV): Secondary | ICD-10-CM | POA: Diagnosis not present

## 2023-03-08 DIAGNOSIS — Z124 Encounter for screening for malignant neoplasm of cervix: Secondary | ICD-10-CM | POA: Diagnosis not present

## 2023-03-08 DIAGNOSIS — Z6824 Body mass index (BMI) 24.0-24.9, adult: Secondary | ICD-10-CM | POA: Diagnosis not present

## 2023-03-08 MED ORDER — PROGESTERONE MICRONIZED 100 MG PO CAPS
100.0000 mg | ORAL_CAPSULE | Freq: Every day | ORAL | 4 refills | Status: AC
  Filled 2023-03-08 – 2023-05-22 (×2): qty 90, 90d supply, fill #0

## 2023-03-08 MED ORDER — ESTRADIOL 0.05 MG/24HR TD PTTW
1.0000 | MEDICATED_PATCH | TRANSDERMAL | 3 refills | Status: DC
  Filled 2023-03-08: qty 24, 84d supply, fill #0

## 2023-04-02 ENCOUNTER — Other Ambulatory Visit (HOSPITAL_BASED_OUTPATIENT_CLINIC_OR_DEPARTMENT_OTHER): Payer: Self-pay

## 2023-04-02 DIAGNOSIS — H5213 Myopia, bilateral: Secondary | ICD-10-CM | POA: Diagnosis not present

## 2023-04-02 MED ORDER — BETAMETHASONE DIPROPIONATE 0.05 % EX CREA
1.0000 | TOPICAL_CREAM | Freq: Two times a day (BID) | CUTANEOUS | 1 refills | Status: DC
  Filled 2023-04-02: qty 45, 30d supply, fill #0

## 2023-04-03 ENCOUNTER — Other Ambulatory Visit (HOSPITAL_BASED_OUTPATIENT_CLINIC_OR_DEPARTMENT_OTHER): Payer: Self-pay

## 2023-04-06 ENCOUNTER — Other Ambulatory Visit (HOSPITAL_BASED_OUTPATIENT_CLINIC_OR_DEPARTMENT_OTHER): Payer: Self-pay

## 2023-04-06 MED ORDER — ESTRADIOL 0.75 MG/0.75GM TD GEL
1.0000 | Freq: Every day | TRANSDERMAL | 1 refills | Status: DC
  Filled 2023-04-06: qty 30, 30d supply, fill #0

## 2023-04-09 ENCOUNTER — Other Ambulatory Visit: Payer: Self-pay

## 2023-04-09 ENCOUNTER — Other Ambulatory Visit (HOSPITAL_BASED_OUTPATIENT_CLINIC_OR_DEPARTMENT_OTHER): Payer: Self-pay

## 2023-04-09 ENCOUNTER — Telehealth (INDEPENDENT_AMBULATORY_CARE_PROVIDER_SITE_OTHER): Payer: Self-pay | Admitting: Otolaryngology

## 2023-04-09 MED ORDER — ESZOPICLONE 3 MG PO TABS
3.0000 mg | ORAL_TABLET | Freq: Every day | ORAL | 2 refills | Status: DC
  Filled 2023-04-09: qty 90, 90d supply, fill #0

## 2023-04-09 NOTE — Telephone Encounter (Signed)
 Confirmed appt and address with patient for 04/12/2023.

## 2023-04-10 ENCOUNTER — Other Ambulatory Visit (HOSPITAL_BASED_OUTPATIENT_CLINIC_OR_DEPARTMENT_OTHER): Payer: Self-pay

## 2023-04-12 ENCOUNTER — Ambulatory Visit (INDEPENDENT_AMBULATORY_CARE_PROVIDER_SITE_OTHER): Payer: Commercial Managed Care - PPO | Admitting: Otolaryngology

## 2023-04-12 ENCOUNTER — Encounter (INDEPENDENT_AMBULATORY_CARE_PROVIDER_SITE_OTHER): Payer: Self-pay

## 2023-04-12 ENCOUNTER — Other Ambulatory Visit (HOSPITAL_BASED_OUTPATIENT_CLINIC_OR_DEPARTMENT_OTHER): Payer: Self-pay

## 2023-04-12 VITALS — BP 141/94 | HR 70 | Ht 64.0 in | Wt 142.0 lb

## 2023-04-12 DIAGNOSIS — J0181 Other acute recurrent sinusitis: Secondary | ICD-10-CM

## 2023-04-12 MED ORDER — AZELASTINE HCL 0.1 % NA SOLN
2.0000 | Freq: Two times a day (BID) | NASAL | 12 refills | Status: AC
Start: 2023-04-12 — End: ?
  Filled 2023-04-12: qty 30, 30d supply, fill #0

## 2023-04-12 MED ORDER — ESTRADIOL 0.75 MG/1.25 GM (0.06%) TD GEL
Freq: Every day | TRANSDERMAL | 3 refills | Status: DC
  Filled 2023-04-12 (×2): qty 37.5, 30d supply, fill #0
  Filled 2023-05-05 – 2023-05-07 (×2): qty 37.5, 30d supply, fill #1
  Filled 2023-06-06: qty 37.5, 30d supply, fill #2

## 2023-04-12 NOTE — Patient Instructions (Addendum)
 Use astelin  BID two sprays PRN Andres Bangs Med Nasal Saline Rinse  - start nasal saline rinses with NeilMed Bottle available over the counter    Nasal Saline Irrigation instructions: If you choose to make your own salt water solution, You will need: Salt (kosher, canning, or pickling salt) Baking soda Nasal irrigation bottle (i.e. Andres Bangs Med Sinus Rinse) Measuring spoon ( teaspoon) Distilled / boiled water   Mix solution Mix 1 teaspoon of salt, 1/2 teaspoon of baking soda and 1 cup of water into irrigation bottle ** May use saline packet instead of homemade recipe for this step if you prefer If medicine was prescribed to be mixed with solution, place this into bottle Examples 2 inches of 2% mupirocin ointment Budesonide solution Position your head: Lean over sink (about 45 degrees) Rotate head (about 45 degrees) so that one nostril is above the other Irrigate Insert tip of irrigation bottle into upper nostril so it forms a comfortable seal Irrigate while breathing through your mouth May remove the straw from the bottle in order to irrigate the entire solution (important if medicine was added) Exhale through nose when finished and blow nose as necessary  Repeat on opposite side with other 1/2 of solution (120 mL) or remake solution if all 240 mL was used on first side Wash irrigation bottle regularly, replace every 3 months

## 2023-04-12 NOTE — Progress Notes (Signed)
 Dear Dr. Renne Crigler, Here is my assessment for our mutual patient, Cynthia Cain. Thank you for allowing me the opportunity to care for your patient. Please do not hesitate to contact me should you have any other questions. Sincerely, Dr. Jovita Kussmaul  Otolaryngology Clinic Note  HISTORY: Cynthia Cain is a 63 y.o. female kindly referred by Dr. Renne Crigler for evaluation of chronic sinusitis.  Initial visit (04/2023): She reports reports having a particularly bad episode of sinusitis in October - lot of pain/pressure, thick mucoid secretions (then became discolored) and PND and sore throat. Ears were particularly bothersome including ear pain and pressure. She saw Dr. Renne Crigler, and was dx with diagnosed with sinusitis and given Augmentin. No steroids. Improved, then worsened, and then prescribed Augmentin. Resolved mid-November. She had similar symptoms in 2022 after URI, and resolved with antibiotics. Has a history of sinusitis, gets once every 2-4 years. She tried flonase, sinus rinses. Allergy testing has not been done. CT was ordered but not done.  No previous sinonasal surgery.  Not having pain/pressure, congestion, hyposmia, ear pressure/pain. Reports hearing is intact (even more acute since menopause)  She is currently using flonase intermittently (as needed). Not currently using rinses.  GLP-1: no AP/AC: no  Tobacco: no Lives in [place], [place]  H&N Surgery: Tonsillectomy  PMHx: Insomnia, Hypothyroidism  RADIOGRAPHIC EVALUATION AND INDEPENDENT REVIEW OF OTHER RECORDS:: Dr Renne Crigler ([facility]) 02/05/2023 referral notes reviewed and uploaded or available in chart in media tab: noted sinusitis, prior Rx with augmentin. Labs also similarly reviewed and uploaded in media tabl (01/08/2023): CBC and BMP: Bun/Cr 21/0.84; WBC 6.9, Eos200  History reviewed. No pertinent past medical history. History reviewed. No pertinent surgical history. History reviewed. No pertinent family history. Social History   Tobacco  Use   Smoking status: Never   Smokeless tobacco: Never  Substance Use Topics   Alcohol use: Not on file   No Known Allergies Current Outpatient Medications  Medication Sig Dispense Refill   azelastine (ASTELIN) 0.1 % nasal spray Place 2 sprays into both nostrils 2 (two) times daily. Use in each nostril as directed 30 mL 12   levothyroxine (SYNTHROID) 75 MCG tablet Take 1 tablet (75 mcg total) by mouth on an empty stomach once daily before breakfast. 90 tablet 3   progesterone (PROMETRIUM) 100 MG capsule Take 1 capsule (100 mg total) by mouth at bedtime. 90 capsule 4   sertraline (ZOLOFT) 50 MG tablet Take 1 tablet (50 mg total) by mouth daily. 90 tablet 3   sertraline (ZOLOFT) 50 MG tablet Take 1 tablet (50 mg total) by mouth daily. 90 tablet 3   SYNTHROID 75 MCG tablet   4   amoxicillin-clavulanate (AUGMENTIN) 875-125 MG tablet Take 1 tablet by mouth every 12 hours for 5 days (Patient not taking: Reported on 04/12/2023) 10 tablet 0   amoxicillin-clavulanate (AUGMENTIN) 875-125 MG tablet Take 1 tablet by mouth every 12 (twelve) hours for 7 days (Patient not taking: Reported on 04/12/2023) 14 tablet 0   betamethasone dipropionate 0.05 % cream Apply 1 Application to affected area(s) topically 2 (two) times daily. (Patient not taking: Reported on 04/12/2023) 45 g 1   clobetasol (TEMOVATE) 0.05 % external solution Apply 1 Application topically daily. (Patient not taking: Reported on 04/12/2023) 50 mL 1   COVID-19 mRNA vaccine, Pfizer, 30 MCG/0.3ML injection Inject into the muscle. 0.3 mL 0   Estradiol 0.75 MG/0.75GM GEL APPLY 1 PACKET ONE TIME DAILY AS DIRECTED 90 each 3   Estradiol 0.75 MG/0.75GM GEL Apply 1 packet  topically daily as directed (Patient not taking: Reported on 04/12/2023) 90 each 1   Estradiol 0.75 MG/1.25 GM (0.06%) topical gel Apply 1 pump every day by topical route for 90 days. (Patient not taking: Reported on 04/12/2023) 37.5 g 3   Eszopiclone 3 MG TABS TAKE 1 TABLET BY MOUTH  IMMEDIATELY BEFORE BEDTIME (Patient not taking: Reported on 04/12/2023)  3   Eszopiclone 3 MG TABS TAKE 1 TABLET BY MOUTH ONCE DAILY IMMEDIATELY BEFORE BEDTIME 90 tablet 1   eszopiclone 3 MG TABS Take 1 tablet (3 mg total) by mouth immediately before bedtime. 90 tablet 2   fluticasone (FLONASE ALLERGY RELIEF) 50 MCG/ACT nasal spray Place 1 spray in each nostril daily (Patient not taking: Reported on 04/12/2023) 16 g 1   fluticasone (FLONASE ALLERGY RELIEF) 50 MCG/ACT nasal spray Use1 spray in each nostril daily (Patient not taking: Reported on 04/12/2023) 48 g 3   LIALDA 1.2 g EC tablet TAKE 4 TABLETS BY MOUTH ONCE A DAY  3   molnupiravir EUA (LAGEVRIO) 200 MG CAPS capsule Take 4 capsules by mouth every 12 hours for 5 days 40 capsule 0   polyethylene glycol-electrolytes (NULYTELY) 420 g solution Mix and take as directed by mouth. 4000 mL 0   progesterone (PROMETRIUM) 100 MG capsule   3   progesterone (PROMETRIUM) 100 MG capsule TAKE 1 CAPSULE BY MOUTH EVERY EVENING AT BEDTIME 30 capsule 11   progesterone (PROMETRIUM) 100 MG capsule TAKE 1 CAPSULE BY MOUTH AT BEDTIME NIGHTLY. 30 capsule 0   sertraline (ZOLOFT) 50 MG tablet Take 50 mg by mouth daily.  4   sertraline (ZOLOFT) 50 MG tablet Take 1 tablet (50 mg total) by mouth daily. 90 tablet 3   No current facility-administered medications for this visit.   BP (!) 141/94 (BP Location: Left Arm, Patient Position: Sitting, Cuff Size: Normal)   Pulse 70   Ht 5\' 4"  (1.626 m)   Wt 142 lb (64.4 kg)   SpO2 98%   BMI 24.37 kg/m   PHYSICAL EXAM:  BP (!) 141/94 (BP Location: Left Arm, Patient Position: Sitting, Cuff Size: Normal)   Pulse 70   Ht 5\' 4"  (1.626 m)   Wt 142 lb (64.4 kg)   SpO2 98%   BMI 24.37 kg/m    Salient findings:  CN II-XII intact  Bilateral EAC clear and TM intact with well pneumatized middle ear spaces Nose: Anterior rhinoscopy reveals septum mild dev left, no significant inferior turbinate hypertrophy.  Nasal endoscopy was  indicated to better evaluate the nose and paranasal sinuses, given the patient's history and exam findings, and is detailed below. No lesions of oral cavity/oropharynx No obviously palpable neck masses/lymphadenopathy/thyromegaly No respiratory distress or stridor   PROCEDURE: Diagnostic Nasal Endoscopy Pre-procedure diagnosis: Recurrent acute sinusitis Post-procedure diagnosis: same Indication: See pre-procedure diagnosis and physical exam above Complications: None apparent EBL: 0 mL Anesthesia: Lidocaine 4% and topical decongestant was topically sprayed in each nasal cavity  Description of Procedure:  Patient was identified. The 0 degree endoscope was utilized to evaluate the sinonasal cavities, mucosa, sinus ostia and turbinates and septum.  Overall, signs of mucosal inflammation are not noted.  Also noted are mild septal dev left.  No mucopurulence, polyps, or masses noted.   Right Middle meatus: clear Right SE Recess: clear Left MM: clear Left SE Recess: clear Trace mucoid secretions left NP     Photodocumentation was obtained.  CPT CODE -- 31231 - Mod 25   ASSESSMENT:  63 y.o. F  with:  1. Other acute recurrent sinusitis    Doing much better after abx. Does have some intermittent episodes with full recovery (once every couple of years). Endoscopy reassuring today, no evidence of infection or anatomic predisposition noted We've discussed issues and options today.  We reviewed the nasal endoscopy images together.  The risks, benefits and alternatives were discussed and questions answered.  She has elected to proceed with medical management:  1) Start Astelin BID two sprays each nostril PRN for congestion - especially when has URI or feel like sinus symptom sstart 2) Daily Sinus Rinses during URI and flonase BID - follow up PRN  See below regarding exact medications prescribed this encounter including dosages and route: Meds ordered this encounter  Medications    azelastine (ASTELIN) 0.1 % nasal spray    Sig: Place 2 sprays into both nostrils 2 (two) times daily. Use in each nostril as directed    Dispense:  30 mL    Refill:  12     Thank you for allowing me the opportunity to care for your patient. Please do not hesitate to contact me should you have any other questions.  Sincerely, Jovita Kussmaul, MD Otolaryngologist (ENT), [facility] Phone: [phone] Fax: [phone]  MDM:  Level 4 Complexity/Problems addressed: low Data complexity: mod - independent interpretation of notes, labs - Morbidity: mod  - Drug prescribed or managed: yes  04/13/2023, 7:40 AM

## 2023-05-06 ENCOUNTER — Other Ambulatory Visit: Payer: Self-pay

## 2023-05-07 ENCOUNTER — Other Ambulatory Visit (HOSPITAL_BASED_OUTPATIENT_CLINIC_OR_DEPARTMENT_OTHER): Payer: Self-pay

## 2023-05-07 ENCOUNTER — Other Ambulatory Visit (HOSPITAL_COMMUNITY): Payer: Self-pay

## 2023-05-07 ENCOUNTER — Other Ambulatory Visit: Payer: Self-pay

## 2023-05-22 ENCOUNTER — Other Ambulatory Visit (HOSPITAL_BASED_OUTPATIENT_CLINIC_OR_DEPARTMENT_OTHER): Payer: Self-pay

## 2023-06-07 ENCOUNTER — Other Ambulatory Visit (HOSPITAL_BASED_OUTPATIENT_CLINIC_OR_DEPARTMENT_OTHER): Payer: Self-pay

## 2023-06-10 DIAGNOSIS — M1711 Unilateral primary osteoarthritis, right knee: Secondary | ICD-10-CM | POA: Diagnosis not present

## 2023-06-12 ENCOUNTER — Other Ambulatory Visit (HOSPITAL_BASED_OUTPATIENT_CLINIC_OR_DEPARTMENT_OTHER): Payer: Self-pay

## 2023-06-14 ENCOUNTER — Other Ambulatory Visit (HOSPITAL_BASED_OUTPATIENT_CLINIC_OR_DEPARTMENT_OTHER): Payer: Self-pay

## 2023-06-14 MED ORDER — CLOBETASOL PROPIONATE 0.05 % EX SOLN
1.0000 | Freq: Every day | CUTANEOUS | 1 refills | Status: AC
  Filled 2023-06-14: qty 50, 50d supply, fill #0

## 2023-07-15 ENCOUNTER — Other Ambulatory Visit (HOSPITAL_BASED_OUTPATIENT_CLINIC_OR_DEPARTMENT_OTHER): Payer: Self-pay

## 2023-08-13 ENCOUNTER — Other Ambulatory Visit (HOSPITAL_COMMUNITY): Payer: Self-pay

## 2023-08-17 ENCOUNTER — Other Ambulatory Visit (HOSPITAL_COMMUNITY): Payer: Self-pay

## 2023-12-09 ENCOUNTER — Other Ambulatory Visit (HOSPITAL_COMMUNITY): Payer: Self-pay

## 2023-12-09 MED ORDER — FLUZONE HIGH-DOSE 0.5 ML IM SUSY
0.5000 mL | PREFILLED_SYRINGE | Freq: Once | INTRAMUSCULAR | 0 refills | Status: AC
  Filled 2023-12-09: qty 0.5, 1d supply, fill #0

## 2024-01-07 ENCOUNTER — Other Ambulatory Visit (HOSPITAL_COMMUNITY): Payer: Self-pay

## 2024-01-17 ENCOUNTER — Other Ambulatory Visit (HOSPITAL_BASED_OUTPATIENT_CLINIC_OR_DEPARTMENT_OTHER): Payer: Self-pay

## 2024-01-17 ENCOUNTER — Other Ambulatory Visit (HOSPITAL_COMMUNITY): Payer: Self-pay

## 2024-01-17 MED ORDER — SERTRALINE HCL 50 MG PO TABS
50.0000 mg | ORAL_TABLET | Freq: Every day | ORAL | 3 refills | Status: AC
  Filled 2024-01-17: qty 90, 90d supply, fill #0

## 2024-01-17 MED ORDER — LORAZEPAM 0.5 MG PO TABS
0.5000 mg | ORAL_TABLET | Freq: Two times a day (BID) | ORAL | 1 refills | Status: AC | PRN
  Filled 2024-01-17: qty 30, 15d supply, fill #0

## 2024-01-21 ENCOUNTER — Other Ambulatory Visit (HOSPITAL_BASED_OUTPATIENT_CLINIC_OR_DEPARTMENT_OTHER): Payer: Self-pay

## 2024-01-21 ENCOUNTER — Other Ambulatory Visit: Payer: Self-pay

## 2024-03-25 ENCOUNTER — Encounter (HOSPITAL_BASED_OUTPATIENT_CLINIC_OR_DEPARTMENT_OTHER): Payer: Self-pay | Admitting: Emergency Medicine

## 2024-03-25 ENCOUNTER — Emergency Department (HOSPITAL_BASED_OUTPATIENT_CLINIC_OR_DEPARTMENT_OTHER)
Admission: EM | Admit: 2024-03-25 | Discharge: 2024-03-25 | Disposition: A | Discharge status: Home / Self Care | Attending: Emergency Medicine | Admitting: Emergency Medicine

## 2024-03-25 ENCOUNTER — Emergency Department (HOSPITAL_BASED_OUTPATIENT_CLINIC_OR_DEPARTMENT_OTHER)

## 2024-03-25 DIAGNOSIS — W01198A Fall on same level from slipping, tripping and stumbling with subsequent striking against other object, initial encounter: Secondary | ICD-10-CM | POA: Diagnosis not present

## 2024-03-25 DIAGNOSIS — S80211A Abrasion, right knee, initial encounter: Secondary | ICD-10-CM | POA: Insufficient documentation

## 2024-03-25 DIAGNOSIS — S0181XA Laceration without foreign body of other part of head, initial encounter: Secondary | ICD-10-CM | POA: Diagnosis not present

## 2024-03-25 DIAGNOSIS — Z79899 Other long term (current) drug therapy: Secondary | ICD-10-CM | POA: Insufficient documentation

## 2024-03-25 DIAGNOSIS — S80212A Abrasion, left knee, initial encounter: Secondary | ICD-10-CM | POA: Diagnosis not present

## 2024-03-25 DIAGNOSIS — S0990XA Unspecified injury of head, initial encounter: Secondary | ICD-10-CM

## 2024-03-25 DIAGNOSIS — S60511A Abrasion of right hand, initial encounter: Secondary | ICD-10-CM | POA: Insufficient documentation

## 2024-03-25 DIAGNOSIS — S0993XA Unspecified injury of face, initial encounter: Secondary | ICD-10-CM | POA: Diagnosis present

## 2024-03-25 DIAGNOSIS — S60512A Abrasion of left hand, initial encounter: Secondary | ICD-10-CM | POA: Diagnosis not present

## 2024-03-25 DIAGNOSIS — W19XXXA Unspecified fall, initial encounter: Secondary | ICD-10-CM

## 2024-03-25 MED ORDER — LIDOCAINE-EPINEPHRINE (PF) 2 %-1:200000 IJ SOLN
10.0000 mL | Freq: Once | INTRAMUSCULAR | Status: AC
  Administered 2024-03-25: 10 mL
  Filled 2024-03-25: qty 20

## 2024-03-25 NOTE — ED Triage Notes (Signed)
 Fall around 5pm Tripped over leaf blower Hit face Lac around left eye, bleeding Pain in bilateral hand and left knee + headache

## 2024-03-25 NOTE — ED Provider Notes (Signed)
 " [place] EMERGENCY DEPARTMENT AT [facility] Provider Note   CSN: [id number] Arrival date & time: 03/25/24  1726     Patient presents with: Cynthia Cain is a 64 y.o. female.   Patient presents to the emergency department today for evaluation of left-sided facial laceration and periorbital pain after a slip and fall at home.  She fell onto a wood floor striking the left side of her face.  No loss of consciousness.  No subsequent confusion or vomiting.  No neck pain.  No weakness, numbness, or tingling in the arms of the legs.  Patient has had some mild abrasions to her knees and and hands.  Reports mild headache and aching around the left orbit.  No vision change.  No anticoagulation.  Reports tetanus is up-to-date.       Prior to Admission medications  Medication Sig Start Date End Date Taking? Authorizing Provider  azelastine  (ASTELIN ) 0.1 % nasal spray Place 2 sprays into both nostrils 2 (two) times daily. Use in each nostril as directed 04/12/23   Tobie Eldora NOVAK, MD  clobetasol  (TEMOVATE ) 0.05 % external solution Apply 1 Application topically daily. 06/14/23     COVID-19 mRNA vaccine, Pfizer, 30 MCG/0.3ML injection Inject into the muscle. 06/17/20   Luiz Channel, MD  Estradiol  0.75 MG/0.75GM GEL APPLY 1 PACKET ONE TIME DAILY AS DIRECTED 03/31/19 03/30/20  Johnnye Ade, MD  Eszopiclone  3 MG TABS TAKE 1 TABLET BY MOUTH ONCE DAILY IMMEDIATELY BEFORE BEDTIME 01/19/20 07/17/20  Clarice Nottingham, MD  eszopiclone  3 MG TABS Take 1 tablet (3 mg total) by mouth immediately before bedtime. 12/30/20     fluticasone  (FLONASE  ALLERGY RELIEF) 50 MCG/ACT nasal spray Use1 spray in each nostril daily Patient not taking: Reported on 04/12/2023 12/30/20     LIALDA 1.2 g EC tablet TAKE 4 TABLETS BY MOUTH ONCE A DAY 12/17/16   [provider]  LORazepam  (ATIVAN ) 0.5 MG tablet Take 1 tablet (0.5 mg total) by mouth or crushed under the tongue 2 (two) times daily as needed.  01/17/24     molnupiravir  EUA (LAGEVRIO ) 200 MG CAPS capsule Take 4 capsules by mouth every 12 hours for 5 days 12/14/20     polyethylene glycol-electrolytes (NULYTELY) 420 g solution Mix and take as directed by mouth. 10/25/20   Rosalie Kitchens, MD  progesterone  (PROMETRIUM ) 100 MG capsule  01/25/17   [provider]  progesterone  (PROMETRIUM ) 100 MG capsule TAKE 1 CAPSULE BY MOUTH EVERY EVENING AT BEDTIME 12/31/20     progesterone  (PROMETRIUM ) 100 MG capsule TAKE 1 CAPSULE BY MOUTH AT BEDTIME NIGHTLY. 12/31/20     progesterone  (PROMETRIUM ) 100 MG capsule Take 1 capsule (100 mg total) by mouth at bedtime. 03/08/23     sertraline  (ZOLOFT ) 50 MG tablet Take 50 mg by mouth daily. 02/18/17   [provider]  sertraline  (ZOLOFT ) 50 MG tablet Take 1 tablet (50 mg total) by mouth daily. 12/30/20     sertraline  (ZOLOFT ) 50 MG tablet Take 1 tablet (50 mg total) by mouth daily. 12/30/20     sertraline  (ZOLOFT ) 50 MG tablet Take 1 tablet (50 mg total) by mouth daily. 01/21/23     sertraline  (ZOLOFT ) 50 MG tablet Take 1 tablet (50 mg total) by mouth daily. 01/17/24     SYNTHROID  75 MCG tablet  02/18/17   [provider]    Allergies: Patient has no known allergies.    Review of Systems  Updated Vital Signs BP (!) 162/100 (  BP Location: Right Arm)   Pulse 74   Temp 98.2 F (36.8 C)   Resp 17   SpO2 98%   Physical Exam Vitals and nursing note reviewed.  Constitutional:      Appearance: She is well-developed.  HENT:     Head: Normocephalic. No raccoon eyes or Battle's sign.     Comments: There is a 1.5 cm, oblique, mildly gaping laceration lateral to the lateral canthus of the left eye.  It does not involve the lids.  On exploration of the wound, wound base is clean.  It is an oblique wound, extending superiorly and it goes more deeply down towards bone.  No bony injury noted.    Right Ear: Tympanic membrane, ear canal and external ear normal. No hemotympanum.     Left Ear:  Tympanic membrane, ear canal and external ear normal. No hemotympanum.     Nose: Nose normal.     Mouth/Throat:     Pharynx: Uvula midline.  Eyes:     General: Lids are normal.     Extraocular Movements:     Right eye: No nystagmus.     Left eye: No nystagmus.     Conjunctiva/sclera: Conjunctivae normal.     Pupils: Pupils are equal, round, and reactive to light.     Comments: No visible hyphema noted  Cardiovascular:     Rate and Rhythm: Normal rate and regular rhythm.  Pulmonary:     Effort: Pulmonary effort is normal.     Breath sounds: Normal breath sounds.  Abdominal:     Palpations: Abdomen is soft.     Tenderness: There is no abdominal tenderness.  Musculoskeletal:     Cervical back: Normal range of motion and neck supple. No tenderness or bony tenderness.     Thoracic back: No tenderness or bony tenderness.     Lumbar back: No tenderness or bony tenderness.  Skin:    General: Skin is warm and dry.     Comments: Few scattered mild abrasions.  Neurological:     Mental Status: She is alert and oriented to person, place, and time.     GCS: GCS eye subscore is 4. GCS verbal subscore is 5. GCS motor subscore is 6.     Cranial Nerves: No cranial nerve deficit.     Sensory: No sensory deficit.     Coordination: Coordination normal.     (all labs ordered are listed, but only abnormal results are displayed) Labs Reviewed - No data to display  EKG: None  Radiology: CT Cervical Spine Wo Contrast Result Date: 03/25/2024 EXAM: CT CERVICAL SPINE WITHOUT CONTRAST 03/25/2024 06:53:15 PM TECHNIQUE: CT of the cervical spine was performed without the administration of intravenous contrast. Multiplanar reformatted images are provided for review. Automated exposure control, iterative reconstruction, and/or weight based adjustment of the mA/kV was utilized to reduce the radiation dose to as low as reasonably achievable. COMPARISON: Comparison study 08/11/2006. CLINICAL HISTORY: Recent  trip and fall with neck pain. FINDINGS: BONES AND ALIGNMENT: 7 cervical segments are well visualized. Loss of the normal cervical lordosis is noted. Degenerative anterolisthesis of C3 on C4 is noted. Partial fusion of the C4 and C5 vertebral bodies is seen, may be congenital in nature. No acute fracture or acute facet abnormality is seen. DEGENERATIVE CHANGES: Disc space narrowing is noted with mild osteophytic changes at C5-C6 and C6-C7. SOFT TISSUES: Surrounding soft tissue structures are within normal limits. Visualized lung apices are unremarkable. IMPRESSION: 1. No acute fracture or  acute facet abnormality. 2. Loss of the normal cervical lordosis. 3. Degenerative changes as described above. Electronically signed by: Oneil Devonshire MD 03/25/2024 07:01 PM EST RP Workstation: [id number]   CT Head Wo Contrast Result Date: 03/25/2024 EXAM: CT HEAD WITHOUT CONTRAST 03/25/2024 06:53:15 PM TECHNIQUE: CT of the head was performed without the administration of intravenous contrast. Automated exposure control, iterative reconstruction, and/or weight based adjustment of the mA/kV was utilized to reduce the radiation dose to as low as reasonably achievable. COMPARISON: None available. CLINICAL HISTORY: Trip and fall with headaches, initial encounter. FINDINGS: BRAIN AND VENTRICLES: No acute hemorrhage. No evidence of acute infarct. No extra-axial collection. No mass effect or midline shift. ORBITS: No acute abnormality. SINUSES: No acute abnormality. SOFT TISSUES AND SKULL: Skin laceration is noted just lateral to the left orbit. No skull fracture. IMPRESSION: 1. No acute intracranial abnormality. 2. Skin laceration lateral to the left orbit. Electronically signed by: Oneil Devonshire MD 03/25/2024 06:58 PM EST RP Workstation: [id number]     .Laceration Repair  Date/Time: 03/25/2024 8:09 PM  Performed by: Desiderio Chew, PA-C Authorized by: Desiderio Chew, PA-C   Consent:    Consent obtained:  Verbal   Consent given  by:  Patient   Risks discussed:  Infection and pain   Alternatives discussed: dermabond. Universal protocol:    Patient identity confirmed:  Verbally with patient and provided demographic data Anesthesia:    Anesthesia method:  Local infiltration   Local anesthetic:  Lidocaine  2% WITH epi Laceration details:    Length (cm):  2 Exploration:    Hemostasis achieved with:  Direct pressure and epinephrine    Wound extent: no foreign body   Treatment:    Area cleansed with:  Shur-Clens   Amount of cleaning:  Standard Skin repair:    Repair method:  Sutures   Suture size:  6-0   Suture material:  Nylon   Suture technique:  Simple interrupted   Number of sutures:  4 Repair type:    Repair type:  Simple Post-procedure details:    Dressing:  Open (no dressing)   Procedure completion:  Tolerated well, no immediate complications    Medications Ordered in the ED  lidocaine -EPINEPHrine  (XYLOCAINE  W/EPI) 2 %-1:200000 (PF) injection 10 mL (10 mLs Infiltration Given 03/25/24 1935)   ED Course  Patient seen and examined. History obtained directly from patient. Work-up including labs, imaging, EKG ordered in triage, if performed, were reviewed.    Labs/EKG: None ordered  Imaging: Independently visualized and interpreted at bedside.  This included: CT of the head, agree laceration noted, no foreign body, no intracranial injury or bleeding noted; CT of the cervical spine, degenerative changes as well as vertebral fusion noted, no acute fractures.  Medications/Fluids: Ordered: Lidocaine  2% with epinephrine   Most recent vital signs reviewed and are as follows: BP (!) 162/100 (BP Location: Right Arm)   Pulse 74   Temp 98.2 F (36.8 C)   Resp 17   SpO2 98%   Initial impression: Minor head injury, negative imaging.  Laceration will need to be repaired.  Patient agrees to proceed.  ///  Reassessment performed. Patient appears stable.  Tolerated wound closure without difficulty.  Most  current vital signs reviewed and are as follows: BP (!) 162/100 (BP Location: Right Arm)   Pulse 74   Temp 98.2 F (36.8 C)   Resp 17   SpO2 98%   Plan: Discharge to home.   Patient counseled on wound care. Patient counseled on need to return or  see PCP/urgent care for suture removal in 5-7 days. Patient was urged to return to the Emergency Department urgently with worsening pain, swelling, expanding erythema especially if it streaks away from the affected area, fever, or if they have any other concerns. Patient verbalized understanding.   Patient was counseled on head injury precautions and symptoms that should indicate their return to the ED.  These include severe worsening headache, vision changes, confusion, loss of consciousness, trouble walking, nausea & vomiting, or weakness/tingling in extremities.                                    Medical Decision Making Amount and/or Complexity of Data Reviewed Radiology: ordered.  Risk Prescription drug management.   Slip and fall with minor head injury: Negative imaging.  Normal neurologic exam.  No decompensation during ED stay.  Low concern for clinically significant head or neck injury.  No concussion symptoms at this time, but patient counseled to monitor for persistent symptoms at home.  Facial laceration: Repaired without complication.  Tetanus is reportedly up-to-date.  No indication for antibiotics.  Wound is clean.     Final diagnoses:  Fall, initial encounter  Minor head injury, initial encounter  Facial laceration, initial encounter    ED Discharge Orders     None          Desiderio Chew, [place] 03/25/24 2113  "

## 2024-03-25 NOTE — Discharge Instructions (Signed)
 Please read and follow all provided instructions.  Your diagnoses today include:  1. Fall, initial encounter   2. Minor head injury, initial encounter   3. Facial laceration, initial encounter     Tests performed today include: CT scan of your head and neck that did not show any serious injury. Vital signs. See below for your results today.   Medications prescribed:  None  Take any prescribed medications only as directed.  Home care instructions:  Follow any educational materials contained in this packet.  BE VERY CAREFUL not to take multiple medicines containing Tylenol (also called acetaminophen). Doing so can lead to an overdose which can damage your liver and cause liver failure and possibly death.   Follow-up instructions: Please follow-up with your primary care provider in the next 5-7 days for further evaluation of your wound and potential removal of stitches.  Return instructions:  SEEK IMMEDIATE MEDICAL ATTENTION IF: There is confusion or drowsiness (although children frequently become drowsy after injury).  You cannot awaken the injured person.  You have more than one episode of vomiting.  You notice dizziness or unsteadiness which is getting worse, or inability to walk.  You have convulsions or unconsciousness.  You experience severe, persistent headaches not relieved by Tylenol. You cannot use arms or legs normally.  There are changes in pupil sizes. (This is the black center in the colored part of the eye)  There is clear or bloody discharge from the nose or ears.  You have change in speech, vision, swallowing, or understanding.  Localized weakness, numbness, tingling, or change in bowel or bladder control. You have any other emergent concerns.  Additional Information: You have had a head injury which does not appear to require admission at this time.  Your vital signs today were: BP (!) 162/100 (BP Location: Right Arm)   Pulse 74   Temp 98.2 F (36.8 C)    Resp 17   SpO2 98%  If your blood pressure (BP) was elevated above 135/85 this visit, please have this repeated by your doctor within one month. --------------
